# Patient Record
Sex: Female | Born: 1980 | Race: White | Hispanic: No | Marital: Married | Smoking: Former smoker
Health system: Southern US, Community
[De-identification: ages and names within clinical notes are randomized; demographics above are authoritative.]

## PROBLEM LIST (undated history)

## (undated) ENCOUNTER — Inpatient Hospital Stay (HOSPITAL_COMMUNITY): Payer: Self-pay

## (undated) DIAGNOSIS — K8020 Calculus of gallbladder without cholecystitis without obstruction: Secondary | ICD-10-CM

## (undated) DIAGNOSIS — K219 Gastro-esophageal reflux disease without esophagitis: Secondary | ICD-10-CM

## (undated) DIAGNOSIS — F419 Anxiety disorder, unspecified: Secondary | ICD-10-CM

## (undated) DIAGNOSIS — N83201 Unspecified ovarian cyst, right side: Secondary | ICD-10-CM

## (undated) DIAGNOSIS — T4145XA Adverse effect of unspecified anesthetic, initial encounter: Secondary | ICD-10-CM

## (undated) DIAGNOSIS — T7840XA Allergy, unspecified, initial encounter: Secondary | ICD-10-CM

## (undated) DIAGNOSIS — T8859XA Other complications of anesthesia, initial encounter: Secondary | ICD-10-CM

## (undated) DIAGNOSIS — F32A Depression, unspecified: Secondary | ICD-10-CM

## (undated) DIAGNOSIS — F329 Major depressive disorder, single episode, unspecified: Secondary | ICD-10-CM

## (undated) DIAGNOSIS — Z9889 Other specified postprocedural states: Secondary | ICD-10-CM

## (undated) DIAGNOSIS — R112 Nausea with vomiting, unspecified: Secondary | ICD-10-CM

## (undated) DIAGNOSIS — D649 Anemia, unspecified: Secondary | ICD-10-CM

## (undated) HISTORY — DX: Depression, unspecified: F32.A

## (undated) HISTORY — DX: Gastro-esophageal reflux disease without esophagitis: K21.9

## (undated) HISTORY — DX: Major depressive disorder, single episode, unspecified: F32.9

## (undated) HISTORY — DX: Anxiety disorder, unspecified: F41.9

## (undated) HISTORY — PX: WISDOM TOOTH EXTRACTION: SHX21

## (undated) HISTORY — PX: NASAL SEPTOPLASTY W/ TURBINOPLASTY: SHX2070

## (undated) HISTORY — DX: Anemia, unspecified: D64.9

## (undated) HISTORY — DX: Allergy, unspecified, initial encounter: T78.40XA

---

## 2007-06-18 HISTORY — PX: NASAL SEPTOPLASTY W/ TURBINOPLASTY: SHX2070

## 2009-12-25 ENCOUNTER — Ambulatory Visit: Payer: Self-pay | Admitting: Family Medicine

## 2009-12-25 DIAGNOSIS — L259 Unspecified contact dermatitis, unspecified cause: Secondary | ICD-10-CM

## 2009-12-25 LAB — CONVERTED CEMR LAB: Beta hcg, urine, semiquantitative: NEGATIVE

## 2010-07-17 NOTE — Assessment & Plan Note (Signed)
Summary: RASH,REDNESS,SWELLING/TJ   Vital Signs:  Patient Profile:   30 Years Old Female CC:      rash ro bilateral thighs LMP:     11/27/2009 Height:     67 inches Weight:      205 pounds O2 Sat:      100 % O2 treatment:    Room Air Temp:     98.1 degrees F oral Pulse rate:   98 / minute Resp:     14 per minute BP sitting:   117(right arm) Cuff size:   large  Pt. in pain?   yes    Location:   thighs    Type:       burning  Vitals Entered By: Lajean Saver RN (December 25, 2009 5:40 PM)  Menstrual History: LMP (date): 11/27/2009 LMP - Character: normal                   Updated Prior Medication List: ZYRTEC ALLERGY 10 MG TABS (CETIRIZINE HCL) once daily OMEPRAZOLE 20 MG CPDR (OMEPRAZOLE) once daily MULTIVITAMINS  TABS (MULTIPLE VITAMIN) once daily  Current Allergies: ! PCN ! ZOLOFT ! * SEASONAL ! * LOTIONS/SOAPSHistory of Present Illness Chief Complaint: rash ro bilateral thighs History of Present Illness:  Subjective:  Patient was at a department store about one hour prior.  Immediately after trying on a pair of new pants, she developed a well localized pruritic rash on both inner thighs.  She has a history of allergies to certain perfumes and soaps.   She is also concerned that she may be pregnant and requests a pregnancy test.  Her last menstrual period was one month ago.  She denies abdominal pain or vaginal bleeding.  REVIEW OF SYSTEMS Constitutional Symptoms      Denies fever, chills, night sweats, weight loss, weight gain, and fatigue.  Eyes       Denies change in vision, eye pain, eye discharge, glasses, contact lenses, and eye surgery. Ear/Nose/Throat/Mouth       Denies hearing loss/aids, change in hearing, ear pain, ear discharge, dizziness, frequent runny nose, frequent nose bleeds, sinus problems, sore throat, hoarseness, and tooth pain or bleeding.  Respiratory       Denies dry cough, productive cough, wheezing, shortness of breath, asthma,  bronchitis, and emphysema/COPD.  Cardiovascular       Denies murmurs, chest pain, and tires easily with exhertion.    Gastrointestinal       Complains of nausea/vomiting.      Denies stomach pain, diarrhea, constipation, blood in bowel movements, and indigestion.      Comments: every am Genitourniary       Denies painful urination, kidney stones, and loss of urinary control. Neurological       Denies paralysis, seizures, and fainting/blackouts. Musculoskeletal       Denies muscle pain, joint pain, joint stiffness, decreased range of motion, redness, swelling, muscle weakness, and gout.  Skin       Denies bruising, unusual mles/lumps or sores, and hair/skin or nail changes.      Comments: bilateral thighs Psych       Denies mood changes, temper/anger issues, anxiety/stress, speech problems, depression, and sleep problems. Other Comments: Patient was trying on jeans today at [facility] and broke out onto a rash shortly after taking the jeans off.  She states the rash is weeping and burns. She is also concerned she is pregnant and would like a pregnancy test done.   Past History:  Past Medical History:  Unremarkable  Past Surgical History: Caesarean section septaplasty  Family History: Mother- RLS, fibromyalgia Sister- Lupus  Social History: Never Smoked Alcohol use-yes /week- Drug use-no Smoking Status:  never Drug Use:  no   Objective:  No acute distress  Skin:  proximal inner thighs:  Mild bilateral erythema about 10cm dia.  No swelling or vesicles. Urine pregnancy test:  negative Assessment New Problems: CONTACT DERMATITIS&OTHER ECZEMA DUE UNSPEC CAUSE (ICD-692.9)  CONTACT DERMATIS, MILD  Plan New Medications/Changes: TRIAMCINOLONE ACETONIDE 0.1 % CREA (TRIAMCINOLONE ACETONIDE) Apply thin layer to affected area bid  #30gm x 0, 12/25/2009, Donna Christen MD  New Orders: New Patient Level III [phone] Planning Comments:   Begin trimcinolone cream two times a day until  resolved.  May take Benadryl 50mg  Q4 to 6hr as needed.  Apply cool compresses.   Follow-up with PCP if not improving   The patient and/or caregiver has been counseled thoroughly with regard to medications prescribed including dosage, schedule, interactions, rationale for use, and possible side effects and they verbalize understanding.  Diagnoses and expected course of recovery discussed and will return if not improved as expected or if the condition worsens. Patient and/or caregiver verbalized understanding.  Prescriptions: TRIAMCINOLONE ACETONIDE 0.1 % CREA (TRIAMCINOLONE ACETONIDE) Apply thin layer to affected area bid  #30gm x 0   Entered and Authorized by:   Donna Christen MD   Signed by:   Donna Christen MD on 12/25/2009   Method used:   Print then Give to Patient   RxID:   [id number]   Patient Instructions: 1)  May take Benadryl 25mg , 2 caps every 4 to 6 hours  Orders Added: 1)  New Patient Level III [99203]  Laboratory Results   Urine Tests  Date/Time Received: December 25, 2009 6:05 PM  Date/Time Reported: December 25, 2009 6:05 PM     Urine HCG: negative

## 2010-12-18 ENCOUNTER — Encounter: Payer: Self-pay | Admitting: Family Medicine

## 2010-12-18 ENCOUNTER — Inpatient Hospital Stay (INDEPENDENT_AMBULATORY_CARE_PROVIDER_SITE_OTHER)
Admission: RE | Admit: 2010-12-18 | Discharge: 2010-12-18 | Disposition: A | Discharge status: Home / Self Care | Payer: Self-pay | Source: Ambulatory Visit | Attending: Family Medicine | Admitting: Family Medicine

## 2010-12-18 DIAGNOSIS — R300 Dysuria: Secondary | ICD-10-CM

## 2010-12-18 DIAGNOSIS — N3000 Acute cystitis without hematuria: Secondary | ICD-10-CM

## 2010-12-18 LAB — CONVERTED CEMR LAB
Nitrite: POSITIVE
Specific Gravity, Urine: 1.015
Urobilinogen, UA: 4
pH: 5

## 2010-12-20 ENCOUNTER — Telehealth (INDEPENDENT_AMBULATORY_CARE_PROVIDER_SITE_OTHER): Payer: Self-pay | Admitting: *Deleted

## 2010-12-27 ENCOUNTER — Inpatient Hospital Stay (INDEPENDENT_AMBULATORY_CARE_PROVIDER_SITE_OTHER)
Admission: RE | Admit: 2010-12-27 | Discharge: 2010-12-27 | Disposition: A | Discharge status: Home / Self Care | Payer: Self-pay | Source: Ambulatory Visit | Attending: Emergency Medicine | Admitting: Emergency Medicine

## 2010-12-27 ENCOUNTER — Encounter: Payer: Self-pay | Admitting: Emergency Medicine

## 2010-12-27 DIAGNOSIS — M545 Low back pain, unspecified: Secondary | ICD-10-CM

## 2011-01-01 ENCOUNTER — Telehealth (INDEPENDENT_AMBULATORY_CARE_PROVIDER_SITE_OTHER): Payer: Self-pay | Admitting: Emergency Medicine

## 2011-01-07 ENCOUNTER — Encounter: Payer: Self-pay | Admitting: Family Medicine

## 2011-01-07 ENCOUNTER — Telehealth (INDEPENDENT_AMBULATORY_CARE_PROVIDER_SITE_OTHER): Payer: Self-pay | Admitting: *Deleted

## 2011-01-07 ENCOUNTER — Ambulatory Visit (INDEPENDENT_AMBULATORY_CARE_PROVIDER_SITE_OTHER): Payer: Self-pay | Admitting: Family Medicine

## 2011-01-07 VITALS — BP 115/77 | HR 118 | Temp 99.0°F | Ht 67.0 in | Wt 206.6 lb

## 2011-01-07 DIAGNOSIS — M545 Low back pain: Secondary | ICD-10-CM

## 2011-01-07 MED ORDER — MELOXICAM 15 MG PO TABS: 15.0000 mg | ORAL_TABLET | Freq: Every day | ORAL | Status: AC

## 2011-01-07 MED ORDER — PREDNISONE (PAK) 10 MG PO TABS: ORAL_TABLET | ORAL | Status: DC

## 2011-01-07 NOTE — Patient Instructions (Signed)
You have lumbar radiculopathy (a pinched nerve in your low back). Take tylenol for baseline pain relief (1-2 extra strength tabs 3x/day) A prednisone dose pack is the best option for immediate relief and may be prescribed with transition to an anti-inflammatory like aleve or meloxicam (if you do not have stomach or kidney issues). Tramadol or vicodin as needed for severe pain (no driving on this medicine). Flexeril as needed for muscle spasms (no driving on this medicine). Stay as active as possible. Physical therapy has been shown to be helpful as well. Strengthening of low back muscles, abdominal musculature are key for long term pain relief. If not improving, will consider further imaging (MRI) and/or other medications (neurontin, lyrica, nortriptyline) that help with pain. Follow up with me in 6 weeks (shortly after your benefits kick in).  We can give you more pain medication to bridge you there if we need to.

## 2011-01-08 ENCOUNTER — Encounter: Payer: Self-pay | Admitting: Family Medicine

## 2011-01-08 ENCOUNTER — Ambulatory Visit: Payer: Self-pay | Admitting: Family Medicine

## 2011-01-08 DIAGNOSIS — M545 Low back pain: Secondary | ICD-10-CM | POA: Insufficient documentation

## 2011-01-08 NOTE — Assessment & Plan Note (Signed)
history, exam consistent with lumbar radiculopathy from bulging or herniated disc.  Patient does not have insurance at this time though made enough that she would not qualify for [facility] coverage or medicaid - her insurance will start back again in early September.  She does have some money saved up in a health savings account to cover a physical therapy visit for home stretches and exercises so will start this.  Burst prednisone once more with transition to mobic.  Flexeril as needed, tramadol as needed.  Discussed red flags.  F/u in 6 weeks for reevaluation.  See instructions for further.

## 2011-01-08 NOTE — Progress Notes (Signed)
Subjective:    Patient ID: Teresa [name], female    DOB: December 29, 1980, 30 y.o.   MRN: [id number]  PCP: None  HPI 30 yo F here for low back pain.  Patient reports that she frequently has only minor low back pain but no known acute injury in the past. About 2 weeks ago she was sweeping the floor when she felt a pull/twinge in right side of low back that led to radiation into right leg the next day. Pain goes all the way down posterior leg to toes. Some tingling in same distribution. Went to urgent care and was given prednisone x 5 days, tramadol and flexeril.   Prednisone helped some, no benefit with flexeril, and only rarely takes tramadol because she has a daughter and the medicine makes her groggy. No bowel/bladder dysfunction. Pain really started hurting again the past 2 days. 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  Past Medical History  Diagnosis Date  . Allergy   . GERD (gastroesophageal reflux disease)     No current outpatient prescriptions on file prior to visit.    Past Surgical History  Procedure Date  . Wisdom tooth extraction   . Cesarean section   . Nasal septoplasty w/ turbinoplasty     Allergies  Allergen Reactions  . Penicillins   . Sertraline Hcl     History   Social History  . Marital Status: Single    Spouse Name: N/A    Number of Children: N/A  . Years of Education: N/A   Occupational History  . Not on file.   Social History Main Topics  . Smoking status: Former Smoker    Types: Cigarettes    Quit date: 01/06/2006  . Smokeless tobacco: Not on file  . Alcohol Use: Not on file  . Drug Use: Not on file  . Sexually Active: Not on file   Other Topics Concern  . Not on file   Social History Narrative  . No narrative on file    Family History  Problem Relation Age of Onset  . Heart attack  Father   . Hyperlipidemia Father   . Hypertension Father   . Hyperlipidemia Maternal Grandmother   . Hypertension Maternal Grandmother   . Diabetes Maternal Grandfather   . Hyperlipidemia Maternal Grandfather   . Hypertension Maternal Grandfather   . Hypertension Paternal Grandmother   . Sudden death Neg Hx     BP 115/77  Pulse 118  Temp(Src) 99 F (37.2 C) (Oral)  Ht 5\' 7"  (1.702 m)  Wt 206 lb 9.6 oz (93.713 kg)  BMI 32.36 kg/m2  Review of Systems See HPI above.    Objective:   Physical Exam Gen: NAD  Back: No gross deformity, scoliosis. TTP right lumbar paraspinal region.  No midline or bony TTP. FROM but pain starts at 10 degrees extension, 30 degrees flexion.  Worse with flexion than with extension.  Pain with right lateral rotation in right paraspinal lumbar region. Strength LEs 5/5 all muscle groups.  1+ right patellar, 2+ left patellar MSR, 2+ bilateral achilles tendons MSRs. Positive SLR on right and + crossover SLR as well. Sensation diminished medial and lateral right lower leg - intact dorsal foot. Negative logroll bilateral hips Negative fabers and piriformis stretches - pain in lumbar spine, not at SI or in buttocks.  Assessment & Plan:  1. Low back pain - history, exam consistent with lumbar radiculopathy from bulging or herniated disc.  Patient does not have insurance at this time though made enough that she would not qualify for [facility] coverage or medicaid - her insurance will start back again in early September.  She does have some money saved up in a health savings account to cover a physical therapy visit for home stretches and exercises so will start this.  Burst prednisone once more with transition to mobic.  Flexeril as needed, tramadol as needed.  Discussed red flags.  F/u in 6 weeks for reevaluation.  See instructions for further.

## 2011-05-20 NOTE — Progress Notes (Signed)
Summary: LOWER BACK PAIN (room 4)   Vital Signs:  Patient Profile:   30 Years Old Female CC:      Low back pain radiating to right leg Height:     67 inches Weight:      205.50 pounds O2 Sat:      92 % O2 treatment:    Room Air Temp:     98.3 degrees F oral Pulse rate:   78 / minute Resp:     16 per minute BP sitting:   94 / 68  (left arm) Cuff size:   regular  Pt. in pain?   yes    Location:   low back radiating to right leg  Vitals Entered By: Lavell Islam RN (December 27, 2010 2:05 PM)                   Prior Medication List:  ZYRTEC ALLERGY 10 MG TABS (CETIRIZINE HCL) once daily OMEPRAZOLE 20 MG CPDR (OMEPRAZOLE) once daily MULTIVITAMINS  TABS (MULTIPLE VITAMIN) once daily SULFAMETHOXAZOLE-TMP DS 800-160 MG TABS (SULFAMETHOXAZOLE-TRIMETHOPRIM) One by mouth two times a day   Current Allergies (reviewed today): ! PCN ! ZOLOFT ! * SEASONAL ! * LOTIONS/SOAPSHistory of Present Illness History from: patient Chief Complaint: Low back pain radiating to right leg History of Present Illness: Mahogony was here earlier this week for a UTI but those symptoms have completely resolved.  She was sweeping her floor a few days ago and felt a twinge in her R lower back.  No trauma or falling.  The pain is located in her R lower back and radiates to her R leg.  Aleve tends to help.  Her FHx is + for RA, Lupus, and fibromyalgia so she usually gets concerned when she gets pain.  She states that it's not bad but is lingering longer than she would expect.  She currently has no insurance and will get further lab testing when she starts her new job. Trauma: no Bladder/bowel incontinence: no Weakness: no Fever/chills: no Night pain: hard to get comfortable Unexplained weight loss: no Cancer/immunosuppression: no PMH of osteoporosis or chronic steroid use:  no  REVIEW OF SYSTEMS Constitutional Symptoms      Denies fever, chills, night sweats, weight loss, weight gain, and fatigue.  Eyes         Denies change in vision, eye pain, eye discharge, glasses, contact lenses, and eye surgery. Ear/Nose/Throat/Mouth       Denies hearing loss/aids, change in hearing, ear pain, ear discharge, dizziness, frequent runny nose, frequent nose bleeds, sinus problems, sore throat, hoarseness, and tooth pain or bleeding.  Respiratory       Denies dry cough, productive cough, wheezing, shortness of breath, asthma, bronchitis, and emphysema/COPD.  Cardiovascular       Denies murmurs, chest pain, and tires easily with exhertion.    Gastrointestinal       Denies stomach pain, nausea/vomiting, diarrhea, constipation, blood in bowel movements, and indigestion. Genitourniary       Denies painful urination, kidney stones, and loss of urinary control. Neurological       Denies paralysis, seizures, and fainting/blackouts. Musculoskeletal       Complains of muscle pain, joint pain, joint stiffness, and decreased range of motion.      Denies redness, swelling, muscle weakness, and gout.      Comments: low back radiating right leg Skin       Denies bruising, unusual mles/lumps or sores, and hair/skin or nail changes.  Psych  Denies mood changes, temper/anger issues, anxiety/stress, speech problems, depression, and sleep problems. Other Comments: low back pain radiating down right leg x 5 days   Past History:  Family History: Last updated: 12/25/2009 Mother- RLS, fibromyalgia Sister- Lupus  Social History: Last updated: 12/25/2009 Never Smoked Alcohol use-yes /week- Drug use-no  Past Medical History: Reviewed history from 12/25/2009 and no changes required. Unremarkable  Past Surgical History: Reviewed history from 12/25/2009 and no changes required. Caesarean section septaplasty  Family History: Reviewed history from 12/25/2009 and no changes required. Mother- RLS, fibromyalgia Sister- Lupus  Social History: Reviewed history from 12/25/2009 and no changes required. Never  Smoked Alcohol use-yes /week- Drug use-no Physical Exam General appearance: well developed, well nourished, mild distress when transferring from seat to table MSE: oriented to time, place, and person Back: FROM, SLR and crossed SLR is positive for R sided pain. TTP and spasms R lumbar paraspinal, R SI joint and mildly over piriformis and gluts.  No midline tenderness. No CVA tenderness. Normal sensation and strength in LE, normal gait, distal pulses intact. Assessment New Problems: LOWER BACK PAIN (ICD-724.2)   Plan New Medications/Changes: TRAMADOL HCL 50 MG TABS (TRAMADOL HCL) 1 by mouth q6 hrs for pain  #16 x 0, 12/27/2010, Hoyt Koch MD PREDNISONE 20 MG TABS (PREDNISONE) 1 by mouth two times a day for 5 days  #10 x 0, 12/27/2010, Hoyt Koch MD FLEXERIL 5 MG TABS (CYCLOBENZAPRINE HCL) 1 by mouth up to three times a day as needed for spasms  #15 x 0, 12/27/2010, Hoyt Koch MD  New Orders: Est. Patient Level I [id number] Planning Comments:   I feel this is likely a back strain secondary to lifting or housework that is possibly irritating her sciatic nerve (piriformis?).  With a possible underlying rheumatoid disorder, her inflammatory markers could be higher.  I would first like to treat her with prednisone + flexeril + tramadol if needed, massage, heating pad, and avoiding pushing/pulling/lifting for a few days.  No red flags seen or any cause for cauda equina, disc involvement, etc.  She would like to hold off on Xrays and labs for now due to financial reasons.  If worsening or not improving, will need referral to [facility] sports med.   DDx also includes kidney stone due to recent "UTI" but all those symptoms seem to have resolved.   The patient and/or caregiver has been counseled thoroughly with regard to medications prescribed including dosage, schedule, interactions, rationale for use, and possible side effects and they verbalize understanding.  Diagnoses and expected  course of recovery discussed and will return if not improved as expected or if the condition worsens. Patient and/or caregiver verbalized understanding.  Prescriptions: TRAMADOL HCL 50 MG TABS (TRAMADOL HCL) 1 by mouth q6 hrs for pain  #16 x 0   Entered and Authorized by:   Hoyt Koch MD   Signed by:   Hoyt Koch MD on 12/27/2010   Method used:   Print then Give to Patient   RxID:   [id number] PREDNISONE 20 MG TABS (PREDNISONE) 1 by mouth two times a day for 5 days  #10 x 0   Entered and Authorized by:   Hoyt Koch MD   Signed by:   Hoyt Koch MD on 12/27/2010   Method used:   Print then Give to Patient   RxID:   [id number] FLEXERIL 5 MG TABS (CYCLOBENZAPRINE HCL) 1 by mouth up to three times a day as needed for spasms  #15 x 0  Entered and Authorized by:   Hoyt Koch MD   Signed by:   Hoyt Koch MD on 12/27/2010   Method used:   Print then Give to Patient   RxID:   [id number]   Orders Added: 1)  Est. Patient Level I [14782]

## 2011-05-20 NOTE — Progress Notes (Signed)
Summary: POSSIBLE UTI OR KIDNEY STONE?   Vital Signs:  Patient Profile:   30 Years Old Female CC:      flank pain x 3 days, dysuria and hematuria x 1 day LMP:     12/07/2010 Height:     67 inches Weight:      205 pounds O2 Sat:      98 % O2 treatment:    Room Air Temp:     98.5 degrees F oral Pulse rate:   95 / minute Resp:     16 per minute BP sitting:   126 / 83  (left arm) Cuff size:   large  Vitals Entered By: Lajean Saver RN (December 18, 2010 2:23 PM)  Menstrual History: LMP (date): 12/07/2010                  Updated Prior Medication List: ZYRTEC ALLERGY 10 MG TABS (CETIRIZINE HCL) once daily OMEPRAZOLE 20 MG CPDR (OMEPRAZOLE) once daily MULTIVITAMINS  TABS (MULTIPLE VITAMIN) once daily  Current Allergies (reviewed today): ! PCN ! ZOLOFT ! * SEASONAL ! * LOTIONS/SOAPSHistory of Present Illness Chief Complaint: flank pain x 3 days, dysuria and hematuria x 1 day History of Present Illness:  Subjective:  Patient presents complaining of UTI symptoms for 3 days.  Complains of dysuria, frequency, and urgency.  No nocturia.  + hematuria today.  No abnormal vaginal discharge.  No fever/chills/sweats.  No abdominal pain.  She has had low back ache for 3days.  Mild nausea but no vomiting.  Last menstrual period normal 2 weeks ago.      REVIEW OF SYSTEMS Constitutional Symptoms      Denies fever, chills, night sweats, weight loss, weight gain, and fatigue.  Eyes       Denies change in vision, eye pain, eye discharge, glasses, contact lenses, and eye surgery. Ear/Nose/Throat/Mouth       Denies hearing loss/aids, change in hearing, ear pain, ear discharge, dizziness, frequent runny nose, frequent nose bleeds, sinus problems, sore throat, hoarseness, and tooth pain or bleeding.  Respiratory       Denies dry cough, productive cough, wheezing, shortness of breath, asthma, bronchitis, and emphysema/COPD.  Cardiovascular       Denies murmurs, chest pain, and tires easily with  exhertion.    Gastrointestinal       Denies stomach pain, nausea/vomiting, diarrhea, constipation, blood in bowel movements, and indigestion. Genitourniary       Complains of painful urination.      Denies blood or discharge from vagina, kidney stones, and loss of urinary control. Neurological       Denies paralysis, seizures, and fainting/blackouts. Musculoskeletal       Denies muscle pain, joint pain, joint stiffness, decreased range of motion, redness, swelling, muscle weakness, and gout.  Skin       Denies bruising, unusual mles/lumps or sores, and hair/skin or nail changes.  Psych       Denies mood changes, temper/anger issues, anxiety/stress, speech problems, depression, and sleep problems. Other Comments: Patient c/o flank pain x 3 days, dysuria, hematuria and frequency x 1 day. Taken Azo   Past History:  Past Medical History: Reviewed history from 12/25/2009 and no changes required. Unremarkable  Past Surgical History: Reviewed history from 12/25/2009 and no changes required. Caesarean section septaplasty  Family History: Reviewed history from 12/25/2009 and no changes required. Mother- RLS, fibromyalgia Sister- Lupus  Social History: Reviewed history from 12/25/2009 and no changes required. Never Smoked Alcohol use-yes /week-  Drug use-no   Objective:  Appearance:  Patient appears healthy, stated age, and in no acute distress  Eyes:  Pupils are equal, round, and reactive to light and accomodation.  Extraocular movement is intact.  Conjunctivae are not inflamed.  Mouth:  moist mucous membranes  Neck:  Supple.  No adenopathy is present.   Lungs:  Clear to auscultation.  Breath sounds are equal.  Heart:  Regular rate and rhythm without murmurs, rubs, or gallops.  Abdomen:  Tenderness over bladder without masses or hepatosplenomegaly.  Bowel sounds are present.  Mild right flank tenderness.  urinalysis (dipstick): 3+ blood, 3+ leuks, + Nit Assessment New  Problems: DYSURIA (ICD-788.1) ACUTE CYSTITIS (ICD-595.0)   Plan New Medications/Changes: SULFAMETHOXAZOLE-TMP DS 800-160 MG TABS (SULFAMETHOXAZOLE-TRIMETHOPRIM) One by mouth two times a day  #10 x 0, 12/18/2010, Donna Christen MD  New Orders: Urinalysis [CPT-81003] T-Culture, Urine [[id number]] Est. Patient Level III [82956] Planning Comments:   Urine culture pending. Begin Septra DS.  May continue OTC Azo.  Continue increased fluids. Return for worsening symptoms.  Follow-up with PCP if not improving.   The patient and/or caregiver has been counseled thoroughly with regard to medications prescribed including dosage, schedule, interactions, rationale for use, and possible side effects and they verbalize understanding.  Diagnoses and expected course of recovery discussed and will return if not improved as expected or if the condition worsens. Patient and/or caregiver verbalized understanding.  Prescriptions: SULFAMETHOXAZOLE-TMP DS 800-160 MG TABS (SULFAMETHOXAZOLE-TRIMETHOPRIM) One by mouth two times a day  #10 x 0   Entered and Authorized by:   Donna Christen MD   Signed by:   Donna Christen MD on 12/18/2010   Method used:   Print then Give to Patient   RxID:   [id number]   Orders Added: 1)  Urinalysis [CPT-81003] 2)  T-Culture, Urine [[id number]] 3)  Est. Patient Level III [02725]    Laboratory Results   Urine Tests  Date/Time Received: December 18, 2010 2:28 PM  Date/Time Reported: December 18, 2010 2:28 PM   Routine Urinalysis   Color: orange Appearance: turbid Glucose: 1+   (Normal Range: Negative) Bilirubin: 2+   (Normal Range: Negative) Ketone: 1+   (Normal Range: Negative) Spec. Gravity: 1.015   (Normal Range: 1.003-1.035) Blood: 3+   (Normal Range: Negative) pH: 5.0   (Normal Range: 5.0-8.0) Protein: 3+   (Normal Range: Negative) Urobilinogen: 4.0   (Normal Range: 0-1) Nitrite: positive   (Normal Range: Negative) Leukocyte Esterace: 3+   (Normal Range:  Negative)

## 2011-05-20 NOTE — Telephone Encounter (Signed)
  Phone Note Outgoing Call Call back at Home Phone [phone] [facility]     Call placed by: Lajean Saver RN,  December 20, 2010 2:14 PM Call placed to: Patient Summary of Call: Callback: No answer. Message left on patient's cell phone to finish antibiotic and call back with questions or concerns.

## 2011-05-20 NOTE — Telephone Encounter (Signed)
  Phone Note Outgoing Call Call back at [facility] Phone [phone]   Call placed by: Emilio Math,  January 01, 2011 2:01 PM Call placed to: Patient Summary of Call: Left msg, hope she's feeling better, call with any questions or concerns     Appended Document:  Patient called back pain is still as bad and she is finished with the prednisone.  Refered her to Dr Pearletha Forge.

## 2011-05-20 NOTE — Telephone Encounter (Signed)
  Phone Note Call from Patient   Caller: Patient Summary of Call: pt came by the office stating that she is having a lot of pain in her low back. she has an appt with dr Pearletha Forge tomorrow. called dr Lazaro Arms office and moved the pts appt up to today @ 3:30pm. pt notified. C.Inza Mikrut,LPN Initial call taken by: Clemens Catholic LPN,  January 07, 2011 1:54 PM

## 2012-07-11 ENCOUNTER — Ambulatory Visit (INDEPENDENT_AMBULATORY_CARE_PROVIDER_SITE_OTHER): Payer: BC Managed Care – PPO | Admitting: Family Medicine

## 2012-07-11 VITALS — BP 115/78 | HR 76 | Temp 98.1°F | Resp 16 | Ht 66.5 in | Wt 212.2 lb

## 2012-07-11 DIAGNOSIS — R05 Cough: Secondary | ICD-10-CM

## 2012-07-11 DIAGNOSIS — J3489 Other specified disorders of nose and nasal sinuses: Secondary | ICD-10-CM

## 2012-07-11 DIAGNOSIS — J069 Acute upper respiratory infection, unspecified: Secondary | ICD-10-CM

## 2012-07-11 DIAGNOSIS — R0981 Nasal congestion: Secondary | ICD-10-CM

## 2012-07-11 LAB — POCT INFLUENZA A/B: Influenza B, POC: NEGATIVE

## 2012-07-11 MED ORDER — IPRATROPIUM BROMIDE 0.03 % NA SOLN: 2.0000 | Freq: Four times a day (QID) | NASAL | Status: DC

## 2012-07-11 MED ORDER — ALBUTEROL SULFATE (2.5 MG/3ML) 0.083% IN NEBU
2.5000 mg | INHALATION_SOLUTION | Freq: Once | RESPIRATORY_TRACT | Status: AC
  Administered 2012-07-11: 2.5 mg via RESPIRATORY_TRACT

## 2012-07-11 MED ORDER — ALBUTEROL SULFATE HFA 108 (90 BASE) MCG/ACT IN AERS: 2.0000 | INHALATION_SPRAY | RESPIRATORY_TRACT | Status: DC | PRN

## 2012-07-11 MED ORDER — GUAIFENESIN-CODEINE 100-10 MG/5ML PO SYRP: 5.0000 mL | ORAL_SOLUTION | Freq: Four times a day (QID) | ORAL | Status: DC | PRN

## 2012-07-11 NOTE — Patient Instructions (Signed)
I recommend frequent warm salt water gargles, hot tea with honey and lemon, rest, and handwashing.  Hot showers or breathing in steam may help loosen the congestion.  Try a netti pot or sinus rinse is also likely to help you feel better and keep this from progressing.  You may want to augment with 12 hr sudafed (BEHIND the counter).  Upper Respiratory Infection, Adult An upper respiratory infection (URI) is also known as the common cold. It is often caused by a type of germ (virus). Colds are easily spread (contagious). You can pass it to others by kissing, coughing, sneezing, or drinking out of the same glass. Usually, you get better in 1 or 2 weeks.  HOME CARE   Only take medicine as told by your doctor.  Use a warm mist humidifier or breathe in steam from a hot shower.  Drink enough water and fluids to keep your pee (urine) clear or pale yellow.  Get plenty of rest.  Return to work when your temperature is back to normal or as told by your doctor. You may use a face mask and wash your hands to stop your cold from spreading. GET HELP RIGHT AWAY IF:   After the first few days, you feel you are getting worse.  You have questions about your medicine.  You have chills, shortness of breath, or brown or red spit (mucus).  You have yellow or brown snot (nasal discharge) or pain in the face, especially when you bend forward.  You have a fever, puffy (swollen) neck, pain when you swallow, or white spots in the back of your throat.  You have a bad headache, ear pain, sinus pain, or chest pain.  You have a high-pitched whistling sound when you breathe in and out (wheezing).  You have a lasting cough or cough up blood.  You have sore muscles or a stiff neck. MAKE SURE YOU:   Understand these instructions.  Will watch your condition.  Will get help right away if you are not doing well or get worse. Document Released: 11/20/2007 Document Revised: 08/26/2011 Document Reviewed:  10/08/2010 [facility] Patient Information 2013 [place], [place].

## 2012-07-11 NOTE — Progress Notes (Signed)
Subjective:    Patient ID: Teresa [name], female    DOB: [birth year], 32 y.o.   MRN: [id number] Chief Complaint  Patient presents with  . Cough    x  2 days   . Nasal Congestion    x 2 days   . Sore Throat    x x 2 days     HPI  Thinks she has a sinus infection but wants to ensure not flu. Stuff chest, coughing  Runny nose, sore throat, sinus pressure, groggy, severe HA, always achy but not more than nml. Is a [employer]ner, broadcasting/film/video. Did get flu shot this yr  Some chest heaviness. Cough nonproductive, nose red, raw, sneezing. Using dayquil w/o relief. Chills. nml gi/gu  Past Medical History  Diagnosis Date  . Allergy   . GERD (gastroesophageal reflux disease)   . Anemia   . Anxiety   . Depression    Current Outpatient Prescriptions on File Prior to Visit  Medication Sig Dispense Refill  . norethindrone-ethinyl estradiol (NECON,BREVICON,MODICON) 0.5-35 MG-MCG tablet Take 1 tablet by mouth daily.      Marland Kitchen omeprazole (PRILOSEC) 20 MG capsule Take 20 mg by mouth daily.        Marland Kitchen albuterol (PROVENTIL HFA;VENTOLIN HFA) 108 (90 BASE) MCG/ACT inhaler Inhale 2 puffs into the lungs every 4 (four) hours as needed for shortness of breath.  1 Inhaler  0  . fexofenadine (ALLEGRA) 180 MG tablet Take 180 mg by mouth daily.        Marland Kitchen ipratropium (ATROVENT) 0.03 % nasal spray Place 2 sprays into the nose 4 (four) times daily.  30 mL  1  . Multiple Vitamin (MULTIVITAMIN) tablet Take 1 tablet by mouth daily.         Allergies  Allergen Reactions  . Penicillins   . Sertraline Hcl      Review of Systems  Constitutional: Positive for chills, activity change and fatigue. Negative for fever, diaphoresis and appetite change.  HENT: Positive for ear pain, congestion, sore throat, rhinorrhea, sneezing, postnasal drip and sinus pressure. Negative for nosebleeds, trouble swallowing, neck pain, neck stiffness, voice change and ear discharge.   Eyes: Negative for discharge and itching.  Respiratory: Positive for cough and  chest tightness. Negative for shortness of breath.   Cardiovascular: Negative for chest pain.  Gastrointestinal: Negative for nausea, vomiting and abdominal pain.  Genitourinary: Negative for dysuria, decreased urine volume and difficulty urinating.  Musculoskeletal: Positive for myalgias, back pain and arthralgias.  Skin: Negative for rash.  Neurological: Positive for headaches. Negative for dizziness and syncope.  Hematological: Negative for adenopathy.  Psychiatric/Behavioral: Positive for sleep disturbance.      BP 115/78  Pulse 76  Temp 98.1 F (36.7 C) (Oral)  Resp 16  Ht 5' 6.5" (1.689 m)  Wt 212 lb 3.2 oz (96.253 kg)  BMI 33.74 kg/m2  SpO2 100%  LMP 07/04/2012 Objective:   Physical Exam  Constitutional: She is oriented to person, place, and time. She appears well-developed and well-nourished. She appears lethargic. She appears ill. No distress.  HENT:  Head: Normocephalic and atraumatic.  Right Ear: External ear and ear canal normal. Tympanic membrane is retracted. A middle ear effusion is present.  Left Ear: External ear and ear canal normal. Tympanic membrane is retracted. A middle ear effusion is present.  Nose: Mucosal edema and rhinorrhea present. Right sinus exhibits maxillary sinus tenderness. Left sinus exhibits maxillary sinus tenderness.  Mouth/Throat: Uvula is midline and mucous membranes are normal. Posterior oropharyngeal erythema present. No  oropharyngeal exudate, posterior oropharyngeal edema or tonsillar abscesses.  Eyes: Conjunctivae normal are normal. Right eye exhibits no discharge. Left eye exhibits no discharge. No scleral icterus.  Neck: Normal range of motion. Neck supple.  Cardiovascular: Normal rate, regular rhythm, normal heart sounds and intact distal pulses.   Pulmonary/Chest: Effort normal and breath sounds normal.  Lymphadenopathy:       Head (right side): Submandibular adenopathy present. No preauricular and no posterior auricular adenopathy  present.       Head (left side): Submandibular adenopathy present. No preauricular and no posterior auricular adenopathy present.    She has no cervical adenopathy.       Right: No supraclavicular adenopathy present.       Left: No supraclavicular adenopathy present.  Neurological: She is oriented to person, place, and time. She appears lethargic.  Skin: Skin is warm and dry. She is not diaphoretic. No erythema.  Psychiatric: She has a normal mood and affect. Her behavior is normal.      Results for orders placed in visit on 07/11/12  POCT INFLUENZA A/B      Component Value Range   Influenza A, POC Negative     Influenza B, POC Negative     Assessment & Plan:   1. Cough  POCT Influenza A/B, albuterol (PROVENTIL) (2.5 MG/3ML) 0.083% nebulizer solution 2.5 mg  2. Nasal congestion  POCT Influenza A/B  3. URI, acute  guaiFENesin-codeine (ROBITUSSIN AC) 100-10 MG/5ML syrup, ipratropium (ATROVENT) 0.03 % nasal spray, albuterol (PROVENTIL HFA;VENTOLIN HFA) 108 (90 BASE) MCG/ACT inhaler  Gave pt alb neb treatment in the office and it did help her chest tightness so start prn alb inhaler as suspect viral URI may be moving into bronchitis. Symptomatic treatment, hand hygiene, fluids, rest, etc. Meds ordered this encounter  Medications         . guaiFENesin-codeine (ROBITUSSIN AC) 100-10 MG/5ML syrup    Sig: Take 5 mLs by mouth 4 (four) times daily as needed for cough.    Dispense:  240 mL    Refill:  0  . ipratropium (ATROVENT) 0.03 % nasal spray    Sig: Place 2 sprays into the nose 4 (four) times daily.    Dispense:  30 mL    Refill:  1  . albuterol (PROVENTIL) (2.5 MG/3ML) 0.083% nebulizer solution 2.5 mg    Sig:   . albuterol (PROVENTIL HFA;VENTOLIN HFA) 108 (90 BASE) MCG/ACT inhaler    Sig: Inhale 2 puffs into the lungs every 4 (four) hours as needed for shortness of breath.    Dispense:  1 Inhaler    Refill:  0

## 2012-08-28 ENCOUNTER — Ambulatory Visit (INDEPENDENT_AMBULATORY_CARE_PROVIDER_SITE_OTHER): Payer: BC Managed Care – PPO | Admitting: Emergency Medicine

## 2012-08-28 ENCOUNTER — Ambulatory Visit: Payer: BC Managed Care – PPO

## 2012-08-28 VITALS — BP 132/70 | HR 117 | Temp 98.4°F | Resp 16 | Ht 66.5 in | Wt 218.0 lb

## 2012-08-28 DIAGNOSIS — R062 Wheezing: Secondary | ICD-10-CM

## 2012-08-28 DIAGNOSIS — R05 Cough: Secondary | ICD-10-CM

## 2012-08-28 MED ORDER — HYDROCOD POLST-CHLORPHEN POLST 10-8 MG/5ML PO LQCR: 5.0000 mL | Freq: Two times a day (BID) | ORAL | Status: DC | PRN

## 2012-08-28 MED ORDER — PREDNISONE 10 MG PO TABS: 10.0000 mg | ORAL_TABLET | Freq: Every day | ORAL | Status: DC

## 2012-08-28 MED ORDER — BENZONATATE 100 MG PO CAPS: ORAL_CAPSULE | ORAL | Status: DC

## 2012-08-28 MED ORDER — ALBUTEROL SULFATE (2.5 MG/3ML) 0.083% IN NEBU
2.5000 mg | INHALATION_SOLUTION | Freq: Once | RESPIRATORY_TRACT | Status: AC
  Administered 2012-08-28: 2.5 mg via RESPIRATORY_TRACT

## 2012-08-28 NOTE — Progress Notes (Signed)
  Subjective:    Patient ID: Teresa [name], female    DOB: 03-22-1981, 32 y.o.   MRN: [id number]  HPI Presents today with cough for 2 weeks. Has been taking OTC medications to alleviate cough, not seeming to help.  Went to [facility] ago and was prescribed doxycycline. Has been using inhaler prescribed due to URI last OV. She uses it recently because of issues with catching breath after coughing spells. Has recently moved into a home built in the 1950's. She states the coughing and wheezing started getting worse last night. States it feels like a rattle. Denies any signs of fever.  Review of Systems   Pre peak flow: 400 Post peak flow: 350    Objective:   Physical Exam HEENT exam TMs clear nose congested throat is normal neck is supple chest is clear however when she takes a breath she has bruxism for cough.  [facility] reading (PRIMARY) by  Dr.Venezia Sargeant no acute disease         Assessment & Plan:  Go ahead and give a albuterol treatment followed by chest x-ray. She does have a history of reflux and we'll add Zantac 150 twice a day. I did not see anything on the chest x-ray appeared she did not improve with the albuterol treatment. We'll go ahead and get a six-day taper prednisone along with Tussionex for cough also refilled some Tessalon Perles she has for cough.

## 2012-12-03 ENCOUNTER — Ambulatory Visit (INDEPENDENT_AMBULATORY_CARE_PROVIDER_SITE_OTHER): Payer: BC Managed Care – PPO | Admitting: Family Medicine

## 2012-12-03 VITALS — BP 106/78 | HR 81 | Temp 99.2°F | Resp 16 | Ht 66.5 in | Wt 216.0 lb

## 2012-12-03 DIAGNOSIS — M545 Low back pain, unspecified: Secondary | ICD-10-CM

## 2012-12-03 DIAGNOSIS — M79605 Pain in left leg: Secondary | ICD-10-CM

## 2012-12-03 MED ORDER — PREDNISONE 20 MG PO TABS: ORAL_TABLET | ORAL | Status: DC

## 2012-12-03 MED ORDER — OXAPROZIN 600 MG PO TABS: ORAL_TABLET | ORAL | Status: DC

## 2012-12-03 MED ORDER — METHOCARBAMOL 500 MG PO TABS: ORAL_TABLET | ORAL | Status: DC

## 2012-12-03 MED ORDER — TRAMADOL HCL 50 MG PO TABS: 50.0000 mg | ORAL_TABLET | Freq: Three times a day (TID) | ORAL | Status: DC | PRN

## 2012-12-03 NOTE — Patient Instructions (Addendum)
Take prednisone tapered dose as directed  Take the methocarbamol one in the morning, one in the afternoon, and 2 at bedtime for muscle relaxants  Take the oxaprozin one twice daily for pain and inflammation  Take the tramadol only if needed for severe pain   Return if worse or followup with your back specialist

## 2012-12-03 NOTE — Progress Notes (Signed)
Subjective: Patient has a history of a couple of years ago injuring her back. She's been diagnosed as having disc in her back. Last winter she had injections. It's been hurting her a lot for the past month. It's gotten worse. She says that this morning it was 9/10 and now it is down to 6/10. She has an appointment in early August with the back specialist. Knows of no specific injury this time. Next week she is going out to [facility] and doesn't want to be hurting like this.  Objective: Overweight lady in no major distress. Abdomen soft without mass or tenderness. Straight leg raise test is essentially negative until she gets to about 80 plus. She has radiculopathy to both feet she says, has decreased sensation in the right foot. He can reflexes are 1+ symmetrical. Her flexion of her spine is fair, with pain when she gets down to about 45. Extension and lateral tilt and side to side rotation is good. He is on birth control.  Assessment: Low back pain with radiculopathy  Plan: Keep her more with her back specialist. Micah Flesher ahead and treated her fairly aggressively with oral steroids and muscle accident and anti-inflammatory medication and pain medication. She wants to try to be well for her trip next week. Return for

## 2013-06-05 ENCOUNTER — Ambulatory Visit (INDEPENDENT_AMBULATORY_CARE_PROVIDER_SITE_OTHER): Payer: BC Managed Care – PPO | Admitting: Family Medicine

## 2013-06-05 VITALS — BP 100/78 | HR 78 | Temp 98.0°F | Resp 16 | Ht 67.0 in | Wt 221.0 lb

## 2013-06-05 DIAGNOSIS — J040 Acute laryngitis: Secondary | ICD-10-CM

## 2013-06-05 DIAGNOSIS — J069 Acute upper respiratory infection, unspecified: Secondary | ICD-10-CM

## 2013-06-05 DIAGNOSIS — J209 Acute bronchitis, unspecified: Secondary | ICD-10-CM

## 2013-06-05 MED ORDER — GUAIFENESIN-CODEINE 100-10 MG/5ML PO SYRP: 5.0000 mL | ORAL_SOLUTION | Freq: Four times a day (QID) | ORAL | Status: DC | PRN

## 2013-06-05 MED ORDER — ALBUTEROL SULFATE HFA 108 (90 BASE) MCG/ACT IN AERS: 2.0000 | INHALATION_SPRAY | RESPIRATORY_TRACT | Status: DC | PRN

## 2013-06-05 MED ORDER — AZITHROMYCIN 250 MG PO TABS: ORAL_TABLET | ORAL | Status: DC

## 2013-06-05 NOTE — Patient Instructions (Signed)

## 2013-06-05 NOTE — Progress Notes (Signed)
Patient ID: Arriana Lohmann MRN: [id number], DOB: [birth year], 32 y.o. Date of Encounter: 06/05/2013, 1:40 PM  Primary Physician: No PCP Per Patient  Chief Complaint:  Chief Complaint  Patient presents with   URI    x 1 week    HPI: 32 y.o. year old female with a h/o chronic back pain presents with 8 day history of gradual onset, gradually worsening sore throat. She lists a cough as an associated symptom.  Pt states her cough is sometimes productive consisting of sputum, and tends to wake her from sleep. She says her symptoms are worsened at night time. She denies rhinorrhea, sinus pressure, otalgia, or headache. Normal hearing. No GI complaints. She is able to swallow saliva, but hurts to do so. Decreased appetite secondary to sore throat. She denies a h/o asthma.   Pt teaches at [employer]  Past Medical History  Diagnosis Date   Allergy    GERD (gastroesophageal reflux disease)    Anemia    Anxiety    Depression      Home Meds: Prior to Admission medications   Medication Sig Start Date End Date Taking? Authorizing Provider  cetirizine (ZYRTEC) 10 MG tablet Take 10 mg by mouth daily.   Yes Historical Provider, MD  Multiple Vitamin (MULTIVITAMIN) tablet Take 1 tablet by mouth daily.     Yes Historical Provider, MD  norethindrone-ethinyl estradiol (NECON,BREVICON,MODICON) 0.5-35 MG-MCG tablet Take 1 tablet by mouth daily.   Yes Historical Provider, MD  omeprazole (PRILOSEC) 20 MG capsule Take 20 mg by mouth daily.     Yes Historical Provider, MD  albuterol (PROVENTIL HFA;VENTOLIN HFA) 108 (90 BASE) MCG/ACT inhaler Inhale 2 puffs into the lungs every 4 (four) hours as needed for shortness of breath. 07/11/12   Sherren Mocha, MD  benzonatate (TESSALON) 100 MG capsule 1-2 capsules 3 times a day for cough 08/28/12   Collene Gobble, MD  chlorpheniramine-HYDROcodone (TUSSIONEX PENNKINETIC ER) 10-8 MG/5ML LQCR Take 5 mLs by mouth every 12 (twelve) hours as needed  (cough). 08/28/12   Collene Gobble, MD  fexofenadine (ALLEGRA) 180 MG tablet Take 180 mg by mouth daily.      Historical Provider, MD  guaiFENesin-codeine (ROBITUSSIN AC) 100-10 MG/5ML syrup Take 5 mLs by mouth 4 (four) times daily as needed for cough. 07/11/12   Sherren Mocha, MD  ipratropium (ATROVENT) 0.03 % nasal spray Place 2 sprays into the nose 4 (four) times daily. 07/11/12   Sherren Mocha, MD  methocarbamol (ROBAXIN) 500 MG tablet Take one in the morning, one in the afternoon, and 2 at bedtime for muscle relaxant 12/03/12   Peyton Najjar, MD  oxaprozin (DAYPRO) 600 MG tablet Take one twice daily for pain and inflammation 12/03/12   Peyton Najjar, MD  predniSONE (DELTASONE) 10 MG tablet Take 1 tablet (10 mg total) by mouth daily. 6 per day x 1 day 5 per day x 1 day 4 per day x 1 day 3 per day x 1 day 2 per day x 1 day 1 per day x 1 day 08/28/12   Collene Gobble, MD  predniSONE (DELTASONE) 20 MG tablet Take 3 daily for 2 days, then 2 daily for 2 days, then one daily for 2 days for back 12/03/12   Peyton Najjar, MD  traMADol (ULTRAM) 50 MG tablet Take 1 tablet (50 mg total) by mouth every 8 (eight) hours as needed for pain. 12/03/12   Sandria Bales  Alwyn Ren, MD    Allergies:  Allergies  Allergen Reactions   Penicillins    Sertraline Hcl     History   Social History   Marital Status: Single    Spouse Name: N/A    Number of Children: N/A   Years of Education: N/A   Occupational History   Not on file.   Social History Main Topics   Smoking status: Former Smoker    Types: Cigarettes    Quit date: 01/06/2006   Smokeless tobacco: Never Used   Alcohol Use: 1.2 - 1.8 oz/week    2-3 Glasses of wine per week   Drug Use: No   Sexual Activity: Yes    Birth Control/ Protection: Condom, Pill   Other Topics Concern   Not on file   Social History Narrative   No narrative on file     Review of Systems: Constitutional: negative for chills, fever, night sweats or weight changes HEENT:  see above Cardiovascular: negative for chest pain or palpitations Respiratory: negative for hemoptysis, wheezing, or shortness of breath Abdominal: negative for abdominal pain, nausea, vomiting or diarrhea Dermatological: negative for rash Neurologic: negative for headache   Physical Exam: Blood pressure 100/78, pulse 78, temperature 98 F (36.7 C), temperature source Oral, resp. rate 16, height 5\' 7"  (1.702 m), weight 221 lb (100.245 kg), last menstrual period 05/22/2013, SpO2 100.00%., Body mass index is 34.61 kg/(m^2). General: Well developed, well nourished, in no acute distress. Head: Normocephalic, atraumatic, eyes without discharge, sclera non-icteric, nares are patent. Bilateral auditory canals clear, TM's are without perforation, pearly grey with reflective cone of light bilaterally. No sinus TTP. Oral cavity moist, dentition normal. Posterior pharynx with post nasal drip and mild erythema. No peritonsillar abscess or tonsillar exudate. Neck: Supple. No thyromegaly. Full ROM. No lymphadenopathy. Lungs: Clear bilaterally to auscultation without wheezes, rales, or rhonchi. Breathing is unlabored. Heart: RRR with S1 S2. No murmurs, rubs, or gallops appreciated. Abdomen: Soft, non-tender, non-distended with normoactive bowel sounds. No hepatomegaly. No rebound/guarding. No obvious abdominal masses. Msk:  Strength and tone normal for age. Extremities: No clubbing or cyanosis. No edema. Neuro: Alert and oriented X 3. Moves all extremities spontaneously. CNII-XII grossly in tact. Psych:  Responds to questions appropriately with a normal affect.   Labs:   ASSESSMENT AND PLAN:  32 y.o. year old female with Acute bronchitis - Plan: guaiFENesin-codeine (ROBITUSSIN AC) 100-10 MG/5ML syrup, albuterol (PROVENTIL HFA;VENTOLIN HFA) 108 (90 BASE) MCG/ACT inhaler, azithromycin (ZITHROMAX Z-PAK) 250 MG tablet  URI, acute - Plan: guaiFENesin-codeine (ROBITUSSIN AC) 100-10 MG/5ML syrup, albuterol  (PROVENTIL HFA;VENTOLIN HFA) 108 (90 BASE) MCG/ACT inhaler  Laryngitis   - -Tylenol/Motrin prn -Rest/fluids -RTC precautions -RTC 3-5 days if no improvement  Signed, Elvina Sidle, MD 06/05/2013 1:40 PM

## 2013-10-14 ENCOUNTER — Ambulatory Visit (INDEPENDENT_AMBULATORY_CARE_PROVIDER_SITE_OTHER): Payer: BC Managed Care – PPO | Admitting: Internal Medicine

## 2013-10-14 VITALS — BP 126/72 | HR 82 | Temp 98.3°F | Resp 14 | Ht 66.0 in | Wt 225.8 lb

## 2013-10-14 DIAGNOSIS — J329 Chronic sinusitis, unspecified: Secondary | ICD-10-CM

## 2013-10-14 DIAGNOSIS — J029 Acute pharyngitis, unspecified: Secondary | ICD-10-CM

## 2013-10-14 DIAGNOSIS — J309 Allergic rhinitis, unspecified: Secondary | ICD-10-CM

## 2013-10-14 DIAGNOSIS — J039 Acute tonsillitis, unspecified: Secondary | ICD-10-CM

## 2013-10-14 LAB — POCT RAPID STREP A (OFFICE): Rapid Strep A Screen: NEGATIVE

## 2013-10-14 MED ORDER — AZITHROMYCIN 500 MG PO TABS: 500.0000 mg | ORAL_TABLET | Freq: Every day | ORAL | Status: DC

## 2013-10-14 MED ORDER — FLUTICASONE PROPIONATE 50 MCG/ACT NA SUSP: 2.0000 | Freq: Every day | NASAL | Status: DC

## 2013-10-14 NOTE — Progress Notes (Signed)
   Subjective:    Patient ID: Teresa [name], female    DOB: 1981-05-16, 33 y.o.   MRN: [id number]  HPI Teresa [name] presents to the clinic today with sinus drainage and congestion. She also complains of a sore throat. Her daughter was dx with strep last night. She states she has seasonal allergies in which her symptoms started a week ago. She has been taking zyrtec daily. She has a mild non productive cough. She also has pressure of her maxillary sinuses with a headache to accompany. She also took two benadryl tablets last night which were unhelpful. Teresa [name] denies fever, body aches, nausea, vomiting, or diarrhea. Teresa [name] also denies chest pain.  She has noticed chills starting this morning.   No cp or sob.  Review of Systems School teacher    Objective:   Physical Exam  Vitals reviewed. Constitutional: She is oriented to person, place, and time. She appears well-developed and well-nourished. No distress.  HENT:  Head: Normocephalic.  Right Ear: External ear normal.  Left Ear: External ear normal.  Nose: Mucosal edema, rhinorrhea and sinus tenderness present. Right sinus exhibits maxillary sinus tenderness and frontal sinus tenderness. Left sinus exhibits maxillary sinus tenderness and frontal sinus tenderness.  Mouth/Throat: Uvula is midline. Posterior oropharyngeal erythema present.  Eyes: EOM are normal.  Neck: Normal range of motion.  Pulmonary/Chest: Effort normal.  Neurological: She is alert and oriented to person, place, and time. She exhibits normal muscle tone. Coordination normal.  Psychiatric: She has a normal mood and affect.          Assessment & Plan:  Sinusitis/Early tonsillitis Zithromax 500mg /Fluticasone

## 2013-10-14 NOTE — Patient Instructions (Signed)
Tonsillitis Tonsillitis is an infection of the throat that causes the tonsils to become red, tender, and swollen. Tonsils are collections of lymphoid tissue at the back of the throat. Each tonsil has crevices (crypts). Tonsils help fight nose and throat infections and keep infection from spreading to other parts of the body for the first 18 months of life.  CAUSES Sudden (acute) tonsillitis is usually caused by infection with streptococcal bacteria. Long-lasting (chronic) tonsillitis occurs when the crypts of the tonsils become filled with pieces of food and bacteria, which makes it easy for the tonsils to become repeatedly infected. SYMPTOMS  Symptoms of tonsillitis include:  A sore throat, with possible difficulty swallowing.  White patches on the tonsils.  Fever.  Tiredness.  New episodes of snoring during sleep, when you did not snore before.  Small, foul-smelling, yellowish-white pieces of material (tonsilloliths) that you occasionally cough up or spit out. The tonsilloliths can also cause you to have bad breath. DIAGNOSIS Tonsillitis can be diagnosed through a physical exam. Diagnosis can be confirmed with the results of lab tests, including a throat culture. TREATMENT  The goals of tonsillitis treatment include the reduction of the severity and duration of symptoms and prevention of associated conditions. Symptoms of tonsillitis can be improved with the use of steroids to reduce the swelling. Tonsillitis caused by bacteria can be treated with antibiotics. Usually, treatment with antibiotics is started before the cause of the tonsillitis is known. However, if it is determined that the cause is not bacterial, antibiotics will not treat the tonsillitis. If attacks of tonsillitis are severe and frequent, your caregiver may recommend surgery to remove the tonsils (tonsillectomy). HOME CARE INSTRUCTIONS   Rest as much as possible and get plenty of sleep.  Drink plenty of fluids. While the  throat is very sore, eat soft foods or liquids, such as sherbet, soups, or instant breakfast drinks.  Eat frozen ice pops.  Gargle with a warm or cold liquid to help soothe the throat. Mix 1/4 teaspoon of salt and 1/4 teaspoon of baking soda in in 8 oz of water. SEEK MEDICAL CARE IF:   Large, tender lumps develop in your neck.  A rash develops.  A green, yellow-brown, or bloody substance is coughed up.  You are unable to swallow liquids or food for 24 hours.  You notice that only one of the tonsils is swollen. SEEK IMMEDIATE MEDICAL CARE IF:   You develop any new symptoms such as vomiting, severe headache, stiff neck, chest pain, or trouble breathing or swallowing.  You have severe throat pain along with drooling or voice changes.  You have severe pain, unrelieved with recommended medications.  You are unable to fully open the mouth.  You develop redness, swelling, or severe pain anywhere in the neck.  You have a fever. MAKE SURE YOU:   Understand these instructions.  Will watch your condition.  Will get help right away if you are not doing well or get worse. Document Released: 03/13/2005 Document Revised: 02/03/2013 Document Reviewed: 11/20/2012 [facility] Patient Information 2014 [place], [place]. Sinusitis Sinusitis is redness, soreness, and swelling (inflammation) of the paranasal sinuses. Paranasal sinuses are air pockets within the bones of your face (beneath the eyes, the middle of the forehead, or above the eyes). In healthy paranasal sinuses, mucus is able to drain out, and air is able to circulate through them by way of your nose. However, when your paranasal sinuses are inflamed, mucus and air can become trapped. This can allow bacteria and  other germs to grow and cause infection. Sinusitis can develop quickly and last only a short time (acute) or continue over a long period (chronic). Sinusitis that lasts for more than 12 weeks is considered chronic.  CAUSES  Causes  of sinusitis include:  Allergies.  Structural abnormalities, such as displacement of the cartilage that separates your nostrils (deviated septum), which can decrease the air flow through your nose and sinuses and affect sinus drainage.  Functional abnormalities, such as when the small hairs (cilia) that line your sinuses and help remove mucus do not work properly or are not present. SYMPTOMS  Symptoms of acute and chronic sinusitis are the same. The primary symptoms are pain and pressure around the affected sinuses. Other symptoms include:  Upper toothache.  Earache.  Headache.  Bad breath.  Decreased sense of smell and taste.  A cough, which worsens when you are lying flat.  Fatigue.  Fever.  Thick drainage from your nose, which often is green and may contain pus (purulent).  Swelling and warmth over the affected sinuses. DIAGNOSIS  Your caregiver will perform a physical exam. During the exam, your caregiver may:  Look in your nose for signs of abnormal growths in your nostrils (nasal polyps).  Tap over the affected sinus to check for signs of infection.  View the inside of your sinuses (endoscopy) with a special imaging device with a light attached (endoscope), which is inserted into your sinuses. If your caregiver suspects that you have chronic sinusitis, one or more of the following tests may be recommended:  Allergy tests.  Nasal culture A sample of mucus is taken from your nose and sent to a lab and screened for bacteria.  Nasal cytology A sample of mucus is taken from your nose and examined by your caregiver to determine if your sinusitis is related to an allergy. TREATMENT  Most cases of acute sinusitis are related to a viral infection and will resolve on their own within 10 days. Sometimes medicines are prescribed to help relieve symptoms (pain medicine, decongestants, nasal steroid sprays, or saline sprays).  However, for sinusitis related to a bacterial  infection, your caregiver will prescribe antibiotic medicines. These are medicines that will help kill the bacteria causing the infection.  Rarely, sinusitis is caused by a fungal infection. In theses cases, your caregiver will prescribe antifungal medicine. For some cases of chronic sinusitis, surgery is needed. Generally, these are cases in which sinusitis recurs more than 3 times per year, despite other treatments. HOME CARE INSTRUCTIONS   Drink plenty of water. Water helps thin the mucus so your sinuses can drain more easily.  Use a humidifier.  Inhale steam 3 to 4 times a day (for example, sit in the bathroom with the shower running).  Apply a warm, moist washcloth to your face 3 to 4 times a day, or as directed by your caregiver.  Use saline nasal sprays to help moisten and clean your sinuses.  Take over-the-counter or prescription medicines for pain, discomfort, or fever only as directed by your caregiver. SEEK IMMEDIATE MEDICAL CARE IF:  You have increasing pain or severe headaches.  You have nausea, vomiting, or drowsiness.  You have swelling around your face.  You have vision problems.  You have a stiff neck.  You have difficulty breathing. MAKE SURE YOU:   Understand these instructions.  Will watch your condition.  Will get help right away if you are not doing well or get worse. Document Released: 06/03/2005 Document Revised: 08/26/2011 Document  Reviewed: 06/18/2011 [employer] Patient Information 2014 [place], [place].

## 2014-07-19 ENCOUNTER — Ambulatory Visit (INDEPENDENT_AMBULATORY_CARE_PROVIDER_SITE_OTHER): Payer: BC Managed Care – PPO | Admitting: Emergency Medicine

## 2014-07-19 VITALS — BP 122/82 | HR 89 | Temp 98.2°F | Resp 18 | Ht 67.0 in | Wt 241.8 lb

## 2014-07-19 DIAGNOSIS — J0140 Acute pansinusitis, unspecified: Secondary | ICD-10-CM

## 2014-07-19 DIAGNOSIS — J209 Acute bronchitis, unspecified: Secondary | ICD-10-CM

## 2014-07-19 MED ORDER — LEVOFLOXACIN 500 MG PO TABS: 500.0000 mg | ORAL_TABLET | Freq: Every day | ORAL | Status: AC

## 2014-07-19 MED ORDER — PSEUDOEPHEDRINE-GUAIFENESIN ER 60-600 MG PO TB12: 1.0000 | ORAL_TABLET | Freq: Two times a day (BID) | ORAL | Status: DC

## 2014-07-19 MED ORDER — HYDROCOD POLST-CHLORPHEN POLST 10-8 MG/5ML PO LQCR: 5.0000 mL | Freq: Two times a day (BID) | ORAL | Status: DC | PRN

## 2014-07-19 NOTE — Patient Instructions (Signed)

## 2014-07-19 NOTE — Progress Notes (Signed)
[facility] [address], [place] [address] [phone]  Date:  07/19/2014   Name:  Teresa [name]   DOB:  Nov 14, 1980   MRN:  [id number]  PCP:  No PCP Per Patient    Chief Complaint: Cough and Generalized Body Aches   History of Present Illness:  Teresa [name] is a 34 y.o. very pleasant female patient who presents with the following:  Several week history of nasal congestion and purulent drainage. No sore throat.  Sinus pressure Cough that is persistent with post tussive vomiting.  Purulent sputum No wheezing or shortness of breath No fever or chills No nausea No stool change Has had two zpaks with transient improvement over the past months. No improvement with over the counter medications or other home remedies. Denies other complaint or health concern today.   Patient Active Problem List   Diagnosis Date Noted  . Low back pain radiating to right leg 01/08/2011  . CONTACT DERMATITIS&OTHER ECZEMA DUE [place] CAUSE 12/25/2009    Past Medical History  Diagnosis Date  . Allergy   . GERD (gastroesophageal reflux disease)   . Anemia   . Anxiety   . Depression     Past Surgical History  Procedure Laterality Date  . Wisdom tooth extraction    . Cesarean section    . Nasal septoplasty w/ turbinoplasty      History  Substance Use Topics  . Smoking status: Former Smoker    Types: Cigarettes    Quit date: 01/06/2006  . Smokeless tobacco: Never Used  . Alcohol Use: 1.2 - 1.8 oz/week    2-3 Glasses of wine per week    Family History  Problem Relation Age of Onset  . Heart attack Father   . Hyperlipidemia Father   . Hypertension Father   . Fibromyalgia Father   . Alcohol abuse Father   . Hyperlipidemia Maternal Grandmother   . Hypertension Maternal Grandmother   . Stroke Maternal Grandmother   . Osteoporosis Maternal Grandmother   . Diabetes Maternal Grandfather   . Hyperlipidemia Maternal Grandfather   . Hypertension Maternal  Grandfather   . Hypertension Paternal Grandmother   . Arthritis Paternal Grandmother   . Cancer Paternal Grandmother   . Sudden death Neg Hx   . Fibromyalgia Mother   . Lupus Sister     Allergies  Allergen Reactions  . Penicillins   . Sertraline Hcl     Medication list has been reviewed and updated.  Current Outpatient Prescriptions on File Prior to Visit  Medication Sig Dispense Refill  . albuterol (PROVENTIL HFA;VENTOLIN HFA) 108 (90 BASE) MCG/ACT inhaler Inhale 2 puffs into the lungs every 4 (four) hours as needed for shortness of breath. 1 Inhaler 0  . ipratropium (ATROVENT) 0.03 % nasal spray Place 2 sprays into the nose 4 (four) times daily. 30 mL 1  . Multiple Vitamin (MULTIVITAMIN) tablet Take 1 tablet by mouth daily.      Marland Kitchen omeprazole (PRILOSEC) 20 MG capsule Take 20 mg by mouth daily.      Marland Kitchen azithromycin (ZITHROMAX Z-PAK) 250 MG tablet Take as directed on pack (Patient not taking: Reported on 07/19/2014) 6 tablet 0  . azithromycin (ZITHROMAX) 500 MG tablet Take 1 tablet (500 mg total) by mouth daily. (Patient not taking: Reported on 07/19/2014) 5 tablet 0  . fexofenadine (ALLEGRA) 180 MG tablet Take 180 mg by mouth daily.      . fluticasone (FLONASE) 50 MCG/ACT nasal spray Place 2 sprays  into both nostrils daily. (Patient not taking: Reported on 07/19/2014) 16 g 6  . guaiFENesin-codeine (ROBITUSSIN AC) 100-10 MG/5ML syrup Take 5 mLs by mouth 4 (four) times daily as needed for cough. (Patient not taking: Reported on 07/19/2014) 240 mL 0  . norethindrone-ethinyl estradiol (NECON,BREVICON,MODICON) 0.5-35 MG-MCG tablet Take 1 tablet by mouth daily.     No current facility-administered medications on file prior to visit.    Review of Systems:  As per HPI, otherwise negative.    Physical Examination: Filed Vitals:   07/19/14 0934  BP: 122/82  Pulse: 89  Temp: 98.2 F (36.8 C)  Resp: 18   Filed Vitals:   07/19/14 0934  Height: 5\' 7"  (1.702 m)  Weight: 241 lb 12.8 oz  (109.68 kg)   Body mass index is 37.86 kg/(m^2). Ideal Body Weight: Weight in (lb) to have BMI = 25: 159.3  GEN: WDWN, NAD, Non-toxic, A & O x 3 HEENT: Atraumatic, Normocephalic. Neck supple. No masses, No LAD. Ears and Nose: No external deformity. CV: RRR, No M/G/R. No JVD. No thrill. No extra heart sounds. PULM: CTA B, no wheezes, crackles, rhonchi. No retractions. No resp. distress. No accessory muscle use. ABD: S, NT, ND, +BS. No rebound. No HSM. EXTR: No c/c/e NEURO Normal gait.  PSYCH: Normally interactive. Conversant. Not depressed or anxious appearing.  Calm demeanor.    Assessment and Plan: Sinusitis Bronchitis tussionex mucinex d levaquin  Signed,  Ellison Carwin, MD

## 2015-02-19 ENCOUNTER — Ambulatory Visit (INDEPENDENT_AMBULATORY_CARE_PROVIDER_SITE_OTHER): Payer: BC Managed Care – PPO | Admitting: Physician Assistant

## 2015-02-19 ENCOUNTER — Ambulatory Visit (INDEPENDENT_AMBULATORY_CARE_PROVIDER_SITE_OTHER): Payer: BC Managed Care – PPO

## 2015-02-19 VITALS — BP 110/76 | HR 89 | Temp 98.8°F | Resp 16 | Ht 67.0 in | Wt 246.2 lb

## 2015-02-19 DIAGNOSIS — M25562 Pain in left knee: Secondary | ICD-10-CM | POA: Diagnosis not present

## 2015-02-19 LAB — POCT CBC
Granulocyte percent: 76 %G (ref 37–80)
HCT, POC: 39.4 % (ref 37.7–47.9)
HEMOGLOBIN: 12.7 g/dL (ref 12.2–16.2)
Lymph, poc: 1.6 (ref 0.6–3.4)
MCH: 28.6 pg (ref 27–31.2)
MCHC: 32.2 g/dL (ref 31.8–35.4)
MCV: 89 fL (ref 80–97)
MID (CBC): 0.2 (ref 0–0.9)
MPV: 7.8 fL (ref 0–99.8)
POC GRANULOCYTE: 5.8 (ref 2–6.9)
POC LYMPH PERCENT: 21.1 %L (ref 10–50)
POC MID %: 2.9 % (ref 0–12)
Platelet Count, POC: 324 10*3/uL (ref 142–424)
RBC: 4.42 M/uL (ref 4.04–5.48)
RDW, POC: 13 %
WBC: 7.6 10*3/uL (ref 4.6–10.2)

## 2015-02-19 LAB — POCT URINE PREGNANCY: Preg Test, Ur: NEGATIVE

## 2015-02-19 LAB — POCT SEDIMENTATION RATE: POCT SED RATE: 17 mm/hr (ref 0–22)

## 2015-02-19 NOTE — Progress Notes (Addendum)
[facility] [address] [place] [address] [phone]  Date:  02/19/2015   Name:  Teresa [name]   DOB:  Oct 29, 1980   MRN:  [id number]  PCP:  No PCP Per Patient    History of Present Illness:  Teresa [name] is a 34 y.o. female patient with PMH listed below including various complaints of joint pains, and myalgias, who presents to [facility] for chief complaint of left leg pain that started 2 days ago.  Patient states it began on the lateral side of her knee, and radiated down towards her toes.  Now it is also radiating up to her left hip.  She has no numbness, but a tingling pain to the touch along her entire leg.  Patient has noticed some increased swelling at the knee, and down the leg.  There is no warmth to the touch.  Patient attempted 800mg  of ibuprofen at home, which helped to relieve her pain.   Night before sxs became present, patient had gone to a pool party, had a few drinks, and is unsure if maybe she had twisted her leg as she stood up from poolside, though there was no pain that night.     Patient reports that she has a hx of miscellaneous pains which is being worked up by [employer].  At this time, it is likely fibromyalgia, though some other test are still underweigh for formal diagnosis.    Patient Active Problem List   Diagnosis Date Noted  . Low back pain radiating to right leg 01/08/2011  . CONTACT DERMATITIS&OTHER [facility]ZEMA DUE [place] CAUSE 12/25/2009    Past Medical History  Diagnosis Date  . Allergy   . GERD (gastroesophageal reflux disease)   . Anemia   . Anxiety   . Depression     Past Surgical History  Procedure Laterality Date  . Wisdom tooth extraction    . Cesarean section    . Nasal septoplasty w/ turbinoplasty      Social History  Substance Use Topics  . Smoking status: Former Smoker    Types: Cigarettes    Quit date: 01/06/2006  . Smokeless tobacco: Never Used  . Alcohol Use: 1.2 - 1.8 oz/week    2-3 Glasses of wine per week     Family History  Problem Relation Age of Onset  . Heart attack Father   . Hyperlipidemia Father   . Hypertension Father   . Fibromyalgia Father   . Alcohol abuse Father   . Hyperlipidemia Maternal Grandmother   . Hypertension Maternal Grandmother   . Stroke Maternal Grandmother   . Osteoporosis Maternal Grandmother   . Diabetes Maternal Grandfather   . Hyperlipidemia Maternal Grandfather   . Hypertension Maternal Grandfather   . Hypertension Paternal Grandmother   . Arthritis Paternal Grandmother   . Cancer Paternal Grandmother   . Cancer - Other Paternal Grandmother   . Sudden death Neg Hx   . Fibromyalgia Mother   . Lupus Sister     Allergies  Allergen Reactions  . Penicillins   . Sertraline Hcl     Medication list has been reviewed and updated.  Current Outpatient Prescriptions on File Prior to Visit  Medication Sig Dispense Refill  . albuterol (PROVENTIL HFA;VENTOLIN HFA) 108 (90 BASE) MCG/ACT inhaler Inhale 2 puffs into the lungs every 4 (four) hours as needed for shortness of breath. 1 Inhaler 0  . cetirizine (ZYRTEC) 10 MG tablet Take 10 mg by mouth daily.    Marland Kitchen  fluticasone (FLONASE) 50 MCG/ACT nasal spray Place 2 sprays into both nostrils daily. 16 g 6  . omeprazole (PRILOSEC) 20 MG capsule Take 20 mg by mouth daily.      Marland Kitchen azithromycin (ZITHROMAX Z-PAK) 250 MG tablet Take as directed on pack (Patient not taking: Reported on 07/19/2014) 6 tablet 0  . azithromycin (ZITHROMAX) 500 MG tablet Take 1 tablet (500 mg total) by mouth daily. (Patient not taking: Reported on 07/19/2014) 5 tablet 0  . chlorpheniramine-HYDROcodone (TUSSIONEX PENNKINETIC ER) 10-8 MG/5ML LQCR Take 5 mLs by mouth every 12 (twelve) hours as needed. (Patient not taking: Reported on 02/19/2015) 60 mL 0  . fexofenadine (ALLEGRA) 180 MG tablet Take 180 mg by mouth daily.      Marland Kitchen guaiFENesin-codeine (ROBITUSSIN AC) 100-10 MG/5ML syrup Take 5 mLs by mouth 4 (four) times daily as needed for cough. (Patient not  taking: Reported on 07/19/2014) 240 mL 0  . ipratropium (ATROVENT) 0.03 % nasal spray Place 2 sprays into the nose 4 (four) times daily. (Patient not taking: Reported on 02/19/2015) 30 mL 1  . Multiple Vitamin (MULTIVITAMIN) tablet Take 1 tablet by mouth daily.      . norethindrone-ethinyl estradiol (NECON,BREVICON,MODICON) 0.5-35 MG-MCG tablet Take 1 tablet by mouth daily.    . pseudoephedrine-guaifenesin (MUCINEX D) 60-600 MG per tablet Take 1 tablet by mouth every 12 (twelve) hours. (Patient not taking: Reported on 02/19/2015) 18 tablet 0   No current facility-administered medications on file prior to visit.    ROS ROS otherwise unremarkable unless listed above.   Physical Examination: BP 110/76 mmHg  Pulse 89  Temp(Src) 98.8 F (37.1 C) (Oral)  Resp 16  Ht 5\' 7"  (1.702 m)  Wt 246 lb 3.2 oz (111.676 kg)  BMI 38.55 kg/m2  SpO2 98%  LMP 02/06/2015 Ideal Body Weight: Weight in (lb) to have BMI = 25: 159.3  Physical Exam  Constitutional: She is oriented to person, place, and time. She appears well-developed and well-nourished. No distress.  HENT:  Head: Normocephalic and atraumatic.  Right Ear: External ear normal.  Left Ear: External ear normal.  Nose: Nose normal.  Mouth/Throat: Oropharynx is clear and moist. No oropharyngeal exudate.  Eyes: EOM are normal. Pupils are equal, round, and reactive to light.  Cardiovascular: Normal rate, regular rhythm, normal heart sounds and intact distal pulses.  Exam reveals no friction rub.   No murmur heard. Pulmonary/Chest: Effort normal and breath sounds normal. No respiratory distress. She has no wheezes.  Musculoskeletal: Normal range of motion. She exhibits no edema.       Left knee: She exhibits normal range of motion, no swelling, no erythema, no LCL laxity and no MCL laxity. Tenderness found. Lateral joint line tenderness noted. No medial joint line and no patellar tendon tenderness noted.  Negative anterior drawer test.  Positive  straight leg raise test.  No pain along the sciatic notch.   There is tenderness with just lightly palpating along the skin of the leg, along all dermatomes.    Neurological: She is alert and oriented to person, place, and time.  Skin: Skin is warm and dry. She is not diaphoretic.  Psychiatric: She has a normal mood and affect. Her behavior is normal.    Results for orders placed or performed in visit on 02/19/15  POCT SEDIMENTATION RATE  Result Value Ref Range   POCT SED RATE 17 0 - 22 mm/hr  POCT CBC  Result Value Ref Range   WBC 7.6 4.6 - 10.2 K/uL  Lymph, poc 1.6 0.6 - 3.4   POC LYMPH PERCENT 21.1 10 - 50 %L   MID (cbc) 0.2 0 - 0.9   POC MID % 2.9 0 - 12 %M   POC Granulocyte 5.8 2 - 6.9   Granulocyte percent 76.0 37 - 80 %G   RBC 4.42 4.04 - 5.48 M/uL   Hemoglobin 12.7 12.2 - 16.2 g/dL   HCT, POC 39.4 37.7 - 47.9 %   MCV 89.0 80 - 97 fL   MCH, POC 28.6 27 - 31.2 pg   MCHC 32.2 31.8 - 35.4 g/dL   RDW, POC 13.0 %   Platelet Count, POC 324 142 - 424 K/uL   MPV 7.8 0 - 99.8 fL  POCT urine pregnancy  Result Value Ref Range   Preg Test, Ur Negative Negative   [facility] reading (PRIMARY) by  Dr. Tamala Julian: Negative   Assessment and Plan: 34 year old female is here today for chief complaint of left leg pain.  Possibility of nerve inflammation.  This may be a manifestation of her fibromyalgia.  I have advised to continue the ibuprofen at this time.   Advised ice at the back, and the knee, along with stretches.   She may contact for work note for Tuesday, if she has not had great improvement, however advised to return to [place] or pcp if sxs worsen.   She declines crutches at this time.   Knee pain, left - Plan: POCT SEDIMENTATION RATE, POCT CBC, DG Knee Complete 4 Views Left, POCT urine pregnancy   Ivar Drape, PA-C [facility] 02/19/2015 10:22 AM

## 2015-02-19 NOTE — Patient Instructions (Signed)
Sciatica with Rehab The sciatic nerve runs from the back down the leg and is responsible for sensation and control of the muscles in the back (posterior) side of the thigh, lower leg, and foot. Sciatica is a condition that is characterized by inflammation of this nerve.  SYMPTOMS   Signs of nerve damage, including numbness and/or weakness along the posterior side of the lower extremity.  Pain in the back of the thigh that may also travel down the leg.  Pain that worsens when sitting for long periods of time.  Occasionally, pain in the back or buttock. CAUSES  Inflammation of the sciatic nerve is the cause of sciatica. The inflammation is due to something irritating the nerve. Common sources of irritation include:  Sitting for long periods of time.  Direct trauma to the nerve.  Arthritis of the spine.  Herniated or ruptured disk.  Slipping of the vertebrae (spondylolisthesis).  Pressure from soft tissues, such as muscles or ligament-like tissue (fascia). RISK INCREASES WITH:  Sports that place pressure or stress on the spine (football or weightlifting).  Poor strength and flexibility.  Failure to warm up properly before activity.  Family history of low back pain or disk disorders.  Previous back injury or surgery.  Poor body mechanics, especially when lifting, or poor posture. PREVENTION   Warm up and stretch properly before activity.  Maintain physical fitness:  Strength, flexibility, and endurance.  Cardiovascular fitness.  Learn and use proper technique, especially with posture and lifting. When possible, have coach correct improper technique.  Avoid activities that place stress on the spine. PROGNOSIS If treated properly, then sciatica usually resolves within 6 weeks. However, occasionally surgery is necessary.  RELATED COMPLICATIONS   Permanent nerve damage, including pain, numbness, tingle, or weakness.  Chronic back pain.  Risks of surgery: infection,  bleeding, nerve damage, or damage to surrounding tissues. TREATMENT Treatment initially involves resting from any activities that aggravate your symptoms. The use of ice and medication may help reduce pain and inflammation. The use of strengthening and stretching exercises may help reduce pain with activity. These exercises may be performed at home or with referral to a therapist. A therapist may recommend further treatments, such as transcutaneous electronic nerve stimulation (TENS) or ultrasound. Your caregiver may recommend corticosteroid injections to help reduce inflammation of the sciatic nerve. If symptoms persist despite non-surgical (conservative) treatment, then surgery may be recommended. MEDICATION  If pain medication is necessary, then nonsteroidal anti-inflammatory medications, such as aspirin and ibuprofen, or other minor pain relievers, such as acetaminophen, are often recommended.  Do not take pain medication for 7 days before surgery.  Prescription pain relievers may be given if deemed necessary by your caregiver. Use only as directed and only as much as you need.  Ointments applied to the skin may be helpful.  Corticosteroid injections may be given by your caregiver. These injections should be reserved for the most serious cases, because they may only be given a certain number of times. HEAT AND COLD  Cold treatment (icing) relieves pain and reduces inflammation. Cold treatment should be applied for 10 to 15 minutes every 2 to 3 hours for inflammation and pain and immediately after any activity that aggravates your symptoms. Use ice packs or massage the area with a piece of ice (ice massage).  Heat treatment may be used prior to performing the stretching and strengthening activities prescribed by your caregiver, physical therapist, or athletic trainer. Use a heat pack or soak the injury in warm water.   SEEK MEDICAL CARE IF:  Treatment seems to offer no benefit, or the condition  worsens.  Any medications produce adverse side effects. EXERCISES  RANGE OF MOTION (ROM) AND STRETCHING EXERCISES - Sciatica Most people with sciatic will find that their symptoms worsen with either excessive bending forward (flexion) or arching at the low back (extension). The exercises which will help resolve your symptoms will focus on the opposite motion. Your physician, physical therapist or athletic trainer will help you determine which exercises will be most helpful to resolve your low back pain. Do not complete any exercises without first consulting with your clinician. Discontinue any exercises which worsen your symptoms until you speak to your clinician. If you have pain, numbness or tingling which travels down into your buttocks, leg or foot, the goal of the therapy is for these symptoms to move closer to your back and eventually resolve. Occasionally, these leg symptoms will get better, but your low back pain may worsen; this is typically an indication of progress in your rehabilitation. Be certain to be very alert to any changes in your symptoms and the activities in which you participated in the 24 hours prior to the change. Sharing this information with your clinician will allow him/her to most efficiently treat your condition. These exercises may help you when beginning to rehabilitate your injury. Your symptoms may resolve with or without further involvement from your physician, physical therapist or athletic trainer. While completing these exercises, remember:   Restoring tissue flexibility helps normal motion to return to the joints. This allows healthier, less painful movement and activity.  An effective stretch should be held for at least 30 seconds.  A stretch should never be painful. You should only feel a gentle lengthening or release in the stretched tissue. FLEXION RANGE OF MOTION AND STRETCHING EXERCISES: STRETCH - Flexion, Single Knee to Chest   Lie on a firm bed or floor  with both legs extended in front of you.  Keeping one leg in contact with the floor, bring your opposite knee to your chest. Hold your leg in place by either grabbing behind your thigh or at your knee.  Pull until you feel a gentle stretch in your low back. Hold __________ seconds.  Slowly release your grasp and repeat the exercise with the opposite side. Repeat __________ times. Complete this exercise __________ times per day.  STRETCH - Flexion, Double Knee to Chest  Lie on a firm bed or floor with both legs extended in front of you.  Keeping one leg in contact with the floor, bring your opposite knee to your chest.  Tense your stomach muscles to support your back and then lift your other knee to your chest. Hold your legs in place by either grabbing behind your thighs or at your knees.  Pull both knees toward your chest until you feel a gentle stretch in your low back. Hold __________ seconds.  Tense your stomach muscles and slowly return one leg at a time to the floor. Repeat __________ times. Complete this exercise __________ times per day.  STRETCH - Low Trunk Rotation   Lie on a firm bed or floor. Keeping your legs in front of you, bend your knees so they are both pointed toward the ceiling and your feet are flat on the floor.  Extend your arms out to the side. This will stabilize your upper body by keeping your shoulders in contact with the floor.  Gently and slowly drop both knees together to one side until   you feel a gentle stretch in your low back. Hold for __________ seconds.  Tense your stomach muscles to support your low back as you bring your knees back to the starting position. Repeat the exercise to the other side. Repeat __________ times. Complete this exercise __________ times per day  EXTENSION RANGE OF MOTION AND FLEXIBILITY EXERCISES: STRETCH - Extension, Prone on Elbows  Lie on your stomach on the floor, a bed will be too soft. Place your palms about shoulder  width apart and at the height of your head.  Place your elbows under your shoulders. If this is too painful, stack pillows under your chest.  Allow your body to relax so that your hips drop lower and make contact more completely with the floor.  Hold this position for __________ seconds.  Slowly return to lying flat on the floor. Repeat __________ times. Complete this exercise __________ times per day.  RANGE OF MOTION - Extension, Prone Press Ups  Lie on your stomach on the floor, a bed will be too soft. Place your palms about shoulder width apart and at the height of your head.  Keeping your back as relaxed as possible, slowly straighten your elbows while keeping your hips on the floor. You may adjust the placement of your hands to maximize your comfort. As you gain motion, your hands will come more underneath your shoulders.  Hold this position __________ seconds.  Slowly return to lying flat on the floor. Repeat __________ times. Complete this exercise __________ times per day.  STRENGTHENING EXERCISES - Sciatica  These exercises may help you when beginning to rehabilitate your injury. These exercises should be done near your "sweet spot." This is the neutral, low-back arch, somewhere between fully rounded and fully arched, that is your least painful position. When performed in this safe range of motion, these exercises can be used for people who have either a flexion or extension based injury. These exercises may resolve your symptoms with or without further involvement from your physician, physical therapist or athletic trainer. While completing these exercises, remember:   Muscles can gain both the endurance and the strength needed for everyday activities through controlled exercises.  Complete these exercises as instructed by your physician, physical therapist or athletic trainer. Progress with the resistance and repetition exercises only as your caregiver advises.  You may  experience muscle soreness or fatigue, but the pain or discomfort you are trying to eliminate should never worsen during these exercises. If this pain does worsen, stop and make certain you are following the directions exactly. If the pain is still present after adjustments, discontinue the exercise until you can discuss the trouble with your clinician. STRENGTHENING - Deep Abdominals, Pelvic Tilt   Lie on a firm bed or floor. Keeping your legs in front of you, bend your knees so they are both pointed toward the ceiling and your feet are flat on the floor.  Tense your lower abdominal muscles to press your low back into the floor. This motion will rotate your pelvis so that your tail bone is scooping upwards rather than pointing at your feet or into the floor.  With a gentle tension and even breathing, hold this position for __________ seconds. Repeat __________ times. Complete this exercise __________ times per day.  STRENGTHENING - Abdominals, Crunches   Lie on a firm bed or floor. Keeping your legs in front of you, bend your knees so they are both pointed toward the ceiling and your feet are flat on the   floor. Cross your arms over your chest.  Slightly tip your chin down without bending your neck.  Tense your abdominals and slowly lift your trunk high enough to just clear your shoulder blades. Lifting higher can put excessive stress on the low back and does not further strengthen your abdominal muscles.  Control your return to the starting position. Repeat __________ times. Complete this exercise __________ times per day.  STRENGTHENING - Quadruped, Opposite UE/LE Lift  Assume a hands and knees position on a firm surface. Keep your hands under your shoulders and your knees under your hips. You may place padding under your knees for comfort.  Find your neutral spine and gently tense your abdominal muscles so that you can maintain this position. Your shoulders and hips should form a rectangle  that is parallel with the floor and is not twisted.  Keeping your trunk steady, lift your right hand no higher than your shoulder and then your left leg no higher than your hip. Make sure you are not holding your breath. Hold this position __________ seconds.  Continuing to keep your abdominal muscles tense and your back steady, slowly return to your starting position. Repeat with the opposite arm and leg. Repeat __________ times. Complete this exercise __________ times per day.  STRENGTHENING - Abdominals and Quadriceps, Straight Leg Raise   Lie on a firm bed or floor with both legs extended in front of you.  Keeping one leg in contact with the floor, bend the other knee so that your foot can rest flat on the floor.  Find your neutral spine, and tense your abdominal muscles to maintain your spinal position throughout the exercise.  Slowly lift your straight leg off the floor about 6 inches for a count of 15, making sure to not hold your breath.  Still keeping your neutral spine, slowly lower your leg all the way to the floor. Repeat this exercise with each leg __________ times. Complete this exercise __________ times per day. POSTURE AND BODY MECHANICS CONSIDERATIONS - Sciatica Keeping correct posture when sitting, standing or completing your activities will reduce the stress put on different body tissues, allowing injured tissues a chance to heal and limiting painful experiences. The following are general guidelines for improved posture. Your physician or physical therapist will provide you with any instructions specific to your needs. While reading these guidelines, remember:  The exercises prescribed by your provider will help you have the flexibility and strength to maintain correct postures.  The correct posture provides the optimal environment for your joints to work. All of your joints have less wear and tear when properly supported by a spine with good posture. This means you will  experience a healthier, less painful body.  Correct posture must be practiced with all of your activities, especially prolonged sitting and standing. Correct posture is as important when doing repetitive low-stress activities (typing) as it is when doing a single heavy-load activity (lifting). RESTING POSITIONS Consider which positions are most painful for you when choosing a resting position. If you have pain with flexion-based activities (sitting, bending, stooping, squatting), choose a position that allows you to rest in a less flexed posture. You would want to avoid curling into a fetal position on your side. If your pain worsens with extension-based activities (prolonged standing, working overhead), avoid resting in an extended position such as sleeping on your stomach. Most people will find more comfort when they rest with their spine in a more neutral position, neither too rounded nor too   arched. Lying on a non-sagging bed on your side with a pillow between your knees, or on your back with a pillow under your knees will often provide some relief. Keep in mind, being in any one position for a prolonged period of time, no matter how correct your posture, can still lead to stiffness. PROPER SITTING POSTURE In order to minimize stress and discomfort on your spine, you must sit with correct posture Sitting with good posture should be effortless for a healthy body. Returning to good posture is a gradual process. Many people can work toward this most comfortably by using various supports until they have the flexibility and strength to maintain this posture on their own. When sitting with proper posture, your ears will fall over your shoulders and your shoulders will fall over your hips. You should use the back of the chair to support your upper back. Your low back will be in a neutral position, just slightly arched. You may place a small pillow or folded towel at the base of your low back for support.  When  working at a desk, create an environment that supports good, upright posture. Without extra support, muscles fatigue and lead to excessive strain on joints and other tissues. Keep these recommendations in mind: CHAIR:   A chair should be able to slide under your desk when your back makes contact with the back of the chair. This allows you to work closely.  The chair's height should allow your eyes to be level with the upper part of your monitor and your hands to be slightly lower than your elbows. BODY POSITION  Your feet should make contact with the floor. If this is not possible, use a foot rest.  Keep your ears over your shoulders. This will reduce stress on your neck and low back. INCORRECT SITTING POSTURES   If you are feeling tired and unable to assume a healthy sitting posture, do not slouch or slump. This puts excessive strain on your back tissues, causing more damage and pain. Healthier options include:  Using more support, like a lumbar pillow.  Switching tasks to something that requires you to be upright or walking.  Talking a brief walk.  Lying down to rest in a neutral-spine position. PROLONGED STANDING WHILE SLIGHTLY LEANING FORWARD  When completing a task that requires you to lean forward while standing in one place for a long time, place either foot up on a stationary 2-4 inch high object to help maintain the best posture. When both feet are on the ground, the low back tends to lose its slight inward curve. If this curve flattens (or becomes too large), then the back and your other joints will experience too much stress, fatigue more quickly and can cause pain.  CORRECT STANDING POSTURES Proper standing posture should be assumed with all daily activities, even if they only take a few moments, like when brushing your teeth. As in sitting, your ears should fall over your shoulders and your shoulders should fall over your hips. You should keep a slight tension in your abdominal  muscles to brace your spine. Your tailbone should point down to the ground, not behind your body, resulting in an over-extended swayback posture.  INCORRECT STANDING POSTURES  Common incorrect standing postures include a forward head, locked knees and/or an excessive swayback. WALKING Walk with an upright posture. Your ears, shoulders and hips should all line-up. PROLONGED ACTIVITY IN A FLEXED POSITION When completing a task that requires you to bend forward   at your waist or lean over a low surface, try to find a way to stabilize 3 of 4 of your limbs. You can place a hand or elbow on your thigh or rest a knee on the surface you are reaching across. This will provide you more stability so that your muscles do not fatigue as quickly. By keeping your knees relaxed, or slightly bent, you will also reduce stress across your low back. CORRECT LIFTING TECHNIQUES DO :   Assume a wide stance. This will provide you more stability and the opportunity to get as close as possible to the object which you are lifting.  Tense your abdominals to brace your spine; then bend at the knees and hips. Keeping your back locked in a neutral-spine position, lift using your leg muscles. Lift with your legs, keeping your back straight.  Test the weight of unknown objects before attempting to lift them.  Try to keep your elbows locked down at your sides in order get the best strength from your shoulders when carrying an object.  Always ask for help when lifting heavy or awkward objects. INCORRECT LIFTING TECHNIQUES DO NOT:   Lock your knees when lifting, even if it is a small object.  Bend and twist. Pivot at your feet or move your feet when needing to change directions.  Assume that you cannot safely pick up a paperclip without proper posture. Document Released: 06/03/2005 Document Revised: 10/18/2013 Document Reviewed: 09/15/2008 [employer] Patient Information 2015 [employer]. This information is not intended to  replace advice given to you by your health care provider. Make sure you discuss any questions you have with your health care provider.  

## 2015-02-28 ENCOUNTER — Ambulatory Visit (INDEPENDENT_AMBULATORY_CARE_PROVIDER_SITE_OTHER): Payer: BC Managed Care – PPO | Admitting: Family Medicine

## 2015-02-28 VITALS — BP 128/80 | HR 95 | Temp 98.6°F | Resp 16 | Ht 67.0 in | Wt 249.0 lb

## 2015-02-28 DIAGNOSIS — M25462 Effusion, left knee: Secondary | ICD-10-CM

## 2015-02-28 DIAGNOSIS — T148XXA Other injury of unspecified body region, initial encounter: Secondary | ICD-10-CM

## 2015-02-28 DIAGNOSIS — M25562 Pain in left knee: Secondary | ICD-10-CM

## 2015-02-28 MED ORDER — MELOXICAM 15 MG PO TABS: 15.0000 mg | ORAL_TABLET | Freq: Every day | ORAL | Status: DC

## 2015-02-28 MED ORDER — CYCLOBENZAPRINE HCL 5 MG PO TABS: 5.0000 mg | ORAL_TABLET | Freq: Every evening | ORAL | Status: DC | PRN

## 2015-02-28 NOTE — Progress Notes (Signed)
Chief Complaint:  Chief Complaint  Patient presents with  . Knee Pain    left / x 1 week    HPI: Teresa [name] is a 34 y.o. 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 She has had knee pain for 9 days. No prior injuries. Xrays were neg dor fractureor dislocation, there was some OA like changes. She has been worked up for fibro and that was negative, RF and ESR and also CBC negative.  Last OV from 9/4 Teresa [name] is a 34 y.o. female patient with PMH listed below including various complaints of joint pains, and myalgias, who presents to [facility] for chief complaint of left leg pain that started 2 days ago. Patient states it began on the lateral side of her knee, and radiated down towards her toes. Now it is also radiating up to her left hip. She has no numbness, but a tingling pain to the touch along her entire leg. Patient has noticed some increased swelling at the knee, and down the leg. There is no warmth to the touch. Patient attempted 873m of ibuprofen at home, which helped to relieve her pain.  Night before sxs became present, patient had gone to a pool party, had a few drinks, and is unsure if maybe she had twisted her leg as she stood up from poolside, though there was no pain that night.  Patient reports that she has a hx of miscellaneous pains which is being worked up by [employer] At this time, it is likely fibromyalgia, though some other test are still underweigh for formal diagnosis.   IMPRESSION: There is minimal beaking of the medial tibial spine which likely reflects early osteoarthritic change. There is no significant joint space narrowing or joint effusion or other acute abnormality of the knee.   Electronically  Signed  By: David JMartiniqueM.D.  On: 02/21/2015 13:22  Past Medical History  Diagnosis Date  . Allergy   . GERD (gastroesophageal reflux disease)   . Anemia   . Anxiety   . Depression    Past Surgical History  Procedure Laterality Date  . Wisdom tooth extraction    . Cesarean section    . Nasal septoplasty w/ turbinoplasty     Social History   Social History  . Marital Status: Single    Spouse Name: N/A  . Number of Children: N/A  . Years of Education: N/A   Social History Main Topics  . Smoking status: Former Smoker    Types: Cigarettes    Quit date: 01/06/2006  . Smokeless tobacco: Never Used  . Alcohol Use: 1.2 - 1.8 oz/week    2-3 Glasses of wine per week  . Drug Use: No  . Sexual Activity: Yes    Birth Control/ Protection: Condom, Pill   Other Topics Concern  . None   Social History Narrative   Family History  Problem Relation Age of Onset  . Heart attack Father   . Hyperlipidemia Father   . Hypertension Father   . Fibromyalgia Father   . Alcohol abuse Father   . Hyperlipidemia Maternal Grandmother   . Hypertension Maternal Grandmother   . Stroke Maternal Grandmother   . Osteoporosis  Maternal Grandmother   . Diabetes Maternal Grandfather   . Hyperlipidemia Maternal Grandfather   . Hypertension Maternal Grandfather   . Hypertension Paternal Grandmother   . Arthritis Paternal Grandmother   . Cancer Paternal Grandmother   . Cancer - Other Paternal Grandmother   . Sudden death Neg Hx   . Fibromyalgia Mother   . Lupus Sister    Allergies  Allergen Reactions  . Penicillins   . Sertraline Hcl    Prior to Admission medications   Medication Sig Start Date End Date Taking? Authorizing Provider  albuterol (PROVENTIL HFA;VENTOLIN HFA) 108 (90 BASE) MCG/ACT inhaler Inhale 2 puffs into the lungs every 4 (four) hours as needed for shortness of breath. 06/05/13  Yes Robyn Haber, MD  ALPRAZolam Duanne Moron) 0.25 MG tablet Take 0.25 mg by mouth at bedtime as  needed for anxiety.   Yes Historical Provider, MD  buPROPion (WELLBUTRIN) 75 MG tablet Take 75 mg by mouth 2 (two) times daily.   Yes Historical Provider, MD  cetirizine (ZYRTEC) 10 MG tablet Take 10 mg by mouth daily.   Yes Historical Provider, MD  fluticasone (FLONASE) 50 MCG/ACT nasal spray Place 2 sprays into both nostrils daily. 10/14/13  Yes Orma Flaming, MD  omeprazole (PRILOSEC) 20 MG capsule Take 20 mg by mouth daily.     Yes Historical Provider, MD  cyclobenzaprine (FLEXERIL) 5 MG tablet Take 1 tablet (5 mg total) by mouth at bedtime as needed for muscle spasms. 02/28/15   Rosbel Buckner P Zyair Rhein, DO  fexofenadine (ALLEGRA) 180 MG tablet Take 180 mg by mouth daily.      Historical Provider, MD  ipratropium (ATROVENT) 0.03 % nasal spray Place 2 sprays into the nose 4 (four) times daily. Patient not taking: Reported on 02/19/2015 07/11/12   Shawnee Knapp, MD  meloxicam (MOBIC) 15 MG tablet Take 1 tablet (15 mg total) by mouth daily. Take as needed with food, no other NSAIDs 02/28/15   Takera Rayl P Tahirih Lair, DO  Multiple Vitamin (MULTIVITAMIN) tablet Take 1 tablet by mouth daily.      Historical Provider, MD  norethindrone-ethinyl estradiol (NECON,BREVICON,MODICON) 0.5-35 MG-MCG tablet Take 1 tablet by mouth daily.    Historical Provider, MD     ROS: The patient denies fevers, chills, night sweats, unintentional weight loss, chest pain, palpitations, wheezing, dyspnea on exertion, nausea, vomiting, abdominal pain, dysuria, hematuria, melena, numbness, weakness, or tingling.   All other systems have been reviewed and were otherwise negative with the exception of those mentioned in the HPI and as above.    PHYSICAL EXAM: Filed Vitals:   02/28/15 1624  BP: 128/80  Pulse: 95  Temp: 98.6 F (37 C)  Resp: 16   Body mass index is 38.99 kg/(m^2).   General: Alert, no acute distress, obese white female HEENT:  Normocephalic, atraumatic, oropharynx patent. EOMI, PERRLA Cardiovascular:  Regular rate and rhythm, no rubs  murmurs or gallops.  Respiratory: Clear to auscultation bilaterally.  No wheezes, rales, or rhonchi.  No cyanosis, no use of accessory musculature Abdominal: No organomegaly, abdomen is soft and non-tender, positive bowel sounds. No masses. Skin: No rashes. Neurologic: Facial musculature symmetric. Psychiatric: Patient acts appropriately throughout our interaction. Lymphatic: No cervical or submandibular lymphadenopathy Musculoskeletal: Gait intact.  Minimal swelling left medial side of knee Diffuse tenderness joint line medial and also lateral  Full AROM, full PROM 5/5 strength, 2/2 DTRs ankle ,  Neg Lachman, Neg medial jt line tenderness, neg McMurray L spine normal ROM,  Straight leg negative  Hip normal ROM    LABS: Results for orders placed or performed in visit on 02/19/15  POCT SEDIMENTATION RATE  Result Value Ref Range   POCT SED RATE 17 0 - 22 mm/hr  POCT CBC  Result Value Ref Range   WBC 7.6 4.6 - 10.2 K/uL   Lymph, poc 1.6 0.6 - 3.4   POC LYMPH PERCENT 21.1 10 - 50 %L   MID (cbc) 0.2 0 - 0.9   POC MID % 2.9 0 - 12 %M   POC Granulocyte 5.8 2 - 6.9   Granulocyte percent 76.0 37 - 80 %G   RBC 4.42 4.04 - 5.48 M/uL   Hemoglobin 12.7 12.2 - 16.2 g/dL   HCT, POC 39.4 37.7 - 47.9 %   MCV 89.0 80 - 97 fL   MCH, POC 28.6 27 - 31.2 pg   MCHC 32.2 31.8 - 35.4 g/dL   RDW, POC 13.0 %   Platelet Count, POC 324 142 - 424 K/uL   MPV 7.8 0 - 99.8 fL  POCT urine pregnancy  Result Value Ref Range   Preg Test, Ur Negative Negative     EKG/XRAY:   Primary read interpreted by Dr. Marin Comment at [facility].   ASSESSMENT/PLAN: Encounter Diagnoses  Name Primary?  . Knee pain, left Yes  . Sprain and strain   . Knee swelling, left    Hinge knee brace Rx mobic and flexeril, may take tylenol, stop ibuprofen if taking mobic Refer to ortho   Gross sideeffects, risk and benefits, and alternatives of medications d/w patient. Patient is aware that all medications have potential sideeffects and  we are unable to predict every sideeffect or drug-drug interaction that may occur.  Keylin Podolsky DO  02/28/2015 5:26 PM

## 2015-03-28 ENCOUNTER — Other Ambulatory Visit: Payer: Self-pay | Admitting: Family Medicine

## 2015-06-16 ENCOUNTER — Ambulatory Visit (INDEPENDENT_AMBULATORY_CARE_PROVIDER_SITE_OTHER): Payer: BC Managed Care – PPO | Admitting: Family Medicine

## 2015-06-16 ENCOUNTER — Telehealth: Payer: Self-pay

## 2015-06-16 VITALS — BP 136/86 | HR 105 | Temp 98.6°F | Resp 17 | Ht 66.5 in | Wt 245.0 lb

## 2015-06-16 DIAGNOSIS — F32A Depression, unspecified: Secondary | ICD-10-CM

## 2015-06-16 DIAGNOSIS — M791 Myalgia, unspecified site: Secondary | ICD-10-CM

## 2015-06-16 DIAGNOSIS — R5383 Other fatigue: Secondary | ICD-10-CM | POA: Diagnosis not present

## 2015-06-16 DIAGNOSIS — F329 Major depressive disorder, single episode, unspecified: Secondary | ICD-10-CM | POA: Diagnosis not present

## 2015-06-16 MED ORDER — BUPROPION HCL ER (XL) 150 MG PO TB24: 150.0000 mg | ORAL_TABLET | Freq: Every day | ORAL | Status: DC

## 2015-06-16 NOTE — Progress Notes (Signed)
Patient ID: Teresa [name], female    DOB: 1980-11-14  Age: 34 y.o. MRN: [id number]  Chief Complaint  Patient presents with  . Medication Refill    wellbutron   . Depression    Subjective:   34 year old lady with a long history of depression. For the past 16 years she has been on medication for depression intermittently. She had a regular primary care physician in [place] she was working with, but finds it Dr. his left. The patient had been discussing with her the fact that she has aches and pains, and they talked about fibromyalgia since a relative has that. She has generalized achiness. She has run out of her Wellbutrin 150 mg today. She does not do regular physical exercise, has a fairly sedentary life. She does not smoke. Rarely drinks any alcohol. No major specific symptoms.  Current allergies, medications, problem list, past/family and social histories reviewed.  Objective:  BP 136/86 mmHg  Pulse 105  Temp(Src) 98.6 F (37 C) (Oral)  Resp 17  Ht 5' 6.5" (1.689 m)  Wt 245 lb (111.131 kg)  BMI 38.96 kg/m2  SpO2 99%  LMP 05/27/2015  Pleasant lady, alert and oriented. Throat clear. Neck supple without nodes or thyromegaly. Chest is clear to auscultation. Heart regular without murmurs. Abdomen soft without mass or tenderness  Assessment & Plan:   Assessment: 1. Depression   2. Fatigue due to depression   3. Myalgia       Plan: Encouraged her to get regular exercise.  Her back on a antidepressant medication, may need to go up on the dose if she keeps on having a lot of troubles.  Orders Placed This Encounter  Procedures  . Comprehensive metabolic panel  . TSH  . POCT CBC    Meds ordered this encounter  Medications  . buPROPion (WELLBUTRIN XL) 150 MG 24 hr tablet    Sig: Take 1 tablet (150 mg total) by mouth daily.    Dispense:  90 tablet    Refill:  1         Patient Instructions  Take Wellbutrin XL 150 mg 1 pill daily  Regular exercise, such as  walking. It is recommended that you get at least 30 minutes 5 days a week.  Continue using the alprazolam that you have on an as-needed basis, avoiding regularly use  Return in about 6 months for reassessment  Return sooner if you're having further significant problems  I'll let you know the results of your labs in a few days. If for any reason you do not hear from [place] over the next week or 10 days call back     Return in about 6 months (around 12/15/2015).   Jahmari Esbenshade, MD 06/16/2015

## 2015-06-16 NOTE — Telephone Encounter (Signed)
Patient needs refill on Wellbutrin. It was originally prescribed from another office.   [phone]

## 2015-06-16 NOTE — Telephone Encounter (Signed)
Please see below.  Philis Fendt, MS, PA-C 4:11 PM, 06/16/2015

## 2015-06-16 NOTE — Patient Instructions (Signed)
Take Wellbutrin XL 150 mg 1 pill daily  Regular exercise, such as walking. It is recommended that you get at least 30 minutes 5 days a week.  Continue using the alprazolam that you have on an as-needed basis, avoiding regularly use  Return in about 6 months for reassessment  Return sooner if you're having further significant problems  I'll let you know the results of your labs in a few days. If for any reason you do not hear from [place] over the next week or 10 days call back

## 2015-06-16 NOTE — Telephone Encounter (Signed)
We can do 60 days at the current dosage.  Pt needs to come in soon for future refills.  Philis Fendt, MS, PA-C 4:11 PM, 06/16/2015

## 2015-06-16 NOTE — Telephone Encounter (Signed)
Can we send in or does pt have to RTC?

## 2015-06-19 NOTE — Telephone Encounter (Signed)
Left message Rx was sent.

## 2015-12-15 ENCOUNTER — Other Ambulatory Visit: Payer: Self-pay | Admitting: Family Medicine

## 2016-03-25 ENCOUNTER — Other Ambulatory Visit: Payer: Self-pay | Admitting: Physician Assistant

## 2016-03-26 NOTE — Telephone Encounter (Signed)
05/2015 last ov and labs.  Left message needs appointment this month for eval. And more refills.

## 2016-12-25 ENCOUNTER — Ambulatory Visit (INDEPENDENT_AMBULATORY_CARE_PROVIDER_SITE_OTHER): Payer: BC Managed Care – PPO

## 2016-12-25 VITALS — BP 132/84

## 2016-12-25 DIAGNOSIS — N926 Irregular menstruation, unspecified: Secondary | ICD-10-CM

## 2016-12-25 DIAGNOSIS — Z3201 Encounter for pregnancy test, result positive: Secondary | ICD-10-CM | POA: Diagnosis not present

## 2016-12-25 LAB — POCT URINE PREGNANCY: Preg Test, Ur: POSITIVE — AB

## 2016-12-25 NOTE — Progress Notes (Signed)
Pt presents for nurse visit. LMP 11/24/16. EDD 08/31/17. Pt to start PNV's and schedule NOB visit.

## 2017-01-13 ENCOUNTER — Telehealth: Payer: Self-pay

## 2017-01-13 MED ORDER — DOXYLAMINE-PYRIDOXINE ER 20-20 MG PO TBCR: 1.0000 | EXTENDED_RELEASE_TABLET | Freq: Two times a day (BID) | ORAL | 4 refills | Status: DC

## 2017-01-13 NOTE — Telephone Encounter (Signed)
Pt complains of having really bad nausea and vomiting. She would like to have something sent in to the pharmacy. She states that phenergan did not agree with her during her last pregnancy, and would like something different. Per Ernst Bowler okay to send [place] to [facility]. Rx has been sent. Pt to take Unisom and Vit- B6 until this arrives. Pt aware

## 2017-02-11 ENCOUNTER — Encounter: Payer: BC Managed Care – PPO | Admitting: Certified Nurse Midwife

## 2017-02-13 LAB — OB RESULTS CONSOLE ABO/RH: RH Type: POSITIVE

## 2017-02-13 LAB — OB RESULTS CONSOLE GC/CHLAMYDIA
Chlamydia: NEGATIVE
GC PROBE AMP, GENITAL: NEGATIVE

## 2017-02-13 LAB — OB RESULTS CONSOLE ANTIBODY SCREEN: ANTIBODY SCREEN: NEGATIVE

## 2017-02-13 LAB — OB RESULTS CONSOLE HEPATITIS B SURFACE ANTIGEN: Hepatitis B Surface Ag: NEGATIVE

## 2017-02-13 LAB — OB RESULTS CONSOLE HIV ANTIBODY (ROUTINE TESTING): HIV: NONREACTIVE

## 2017-02-13 LAB — OB RESULTS CONSOLE RUBELLA ANTIBODY, IGM: Rubella: IMMUNE

## 2017-02-13 LAB — OB RESULTS CONSOLE RPR: RPR: NONREACTIVE

## 2017-03-20 ENCOUNTER — Other Ambulatory Visit (HOSPITAL_COMMUNITY): Payer: Self-pay | Admitting: Obstetrics and Gynecology

## 2017-03-20 DIAGNOSIS — Z3689 Encounter for other specified antenatal screening: Secondary | ICD-10-CM

## 2017-04-02 ENCOUNTER — Encounter (HOSPITAL_COMMUNITY): Payer: Self-pay | Admitting: Obstetrics and Gynecology

## 2017-04-09 ENCOUNTER — Encounter (HOSPITAL_COMMUNITY): Payer: Self-pay

## 2017-04-10 ENCOUNTER — Ambulatory Visit (HOSPITAL_COMMUNITY)
Admission: RE | Admit: 2017-04-10 | Discharge: 2017-04-10 | Disposition: A | Discharge status: Home / Self Care | Payer: BC Managed Care – PPO | Source: Ambulatory Visit | Attending: Obstetrics and Gynecology | Admitting: Obstetrics and Gynecology

## 2017-04-10 ENCOUNTER — Other Ambulatory Visit (HOSPITAL_COMMUNITY): Payer: Self-pay | Admitting: *Deleted

## 2017-04-10 ENCOUNTER — Other Ambulatory Visit: Payer: Self-pay | Admitting: Obstetrics & Gynecology

## 2017-04-10 ENCOUNTER — Encounter (HOSPITAL_COMMUNITY): Payer: Self-pay

## 2017-04-10 ENCOUNTER — Other Ambulatory Visit (HOSPITAL_COMMUNITY): Payer: Self-pay | Admitting: Obstetrics and Gynecology

## 2017-04-10 DIAGNOSIS — O09522 Supervision of elderly multigravida, second trimester: Secondary | ICD-10-CM | POA: Diagnosis not present

## 2017-04-10 DIAGNOSIS — O34219 Maternal care for unspecified type scar from previous cesarean delivery: Secondary | ICD-10-CM | POA: Insufficient documentation

## 2017-04-10 DIAGNOSIS — Z8279 Family history of other congenital malformations, deformations and chromosomal abnormalities: Secondary | ICD-10-CM | POA: Insufficient documentation

## 2017-04-10 DIAGNOSIS — Z3A19 19 weeks gestation of pregnancy: Secondary | ICD-10-CM

## 2017-04-10 DIAGNOSIS — O352XX0 Maternal care for (suspected) hereditary disease in fetus, not applicable or unspecified: Secondary | ICD-10-CM

## 2017-04-10 DIAGNOSIS — Z3689 Encounter for other specified antenatal screening: Secondary | ICD-10-CM | POA: Insufficient documentation

## 2017-04-10 DIAGNOSIS — IMO0002 Reserved for concepts with insufficient information to code with codable children: Secondary | ICD-10-CM

## 2017-04-10 DIAGNOSIS — Z0489 Encounter for examination and observation for other specified reasons: Secondary | ICD-10-CM

## 2017-04-15 ENCOUNTER — Inpatient Hospital Stay (HOSPITAL_COMMUNITY)
Admission: AD | Admit: 2017-04-15 | Discharge: 2017-04-15 | Disposition: A | Discharge status: Home / Self Care | Payer: BC Managed Care – PPO | Source: Ambulatory Visit | Attending: Obstetrics and Gynecology | Admitting: Obstetrics and Gynecology

## 2017-04-15 ENCOUNTER — Encounter (HOSPITAL_COMMUNITY): Payer: Self-pay

## 2017-04-15 DIAGNOSIS — Z880 Allergy status to penicillin: Secondary | ICD-10-CM | POA: Insufficient documentation

## 2017-04-15 DIAGNOSIS — R109 Unspecified abdominal pain: Secondary | ICD-10-CM | POA: Insufficient documentation

## 2017-04-15 DIAGNOSIS — Z3A19 19 weeks gestation of pregnancy: Secondary | ICD-10-CM | POA: Insufficient documentation

## 2017-04-15 DIAGNOSIS — Z0371 Encounter for suspected problem with amniotic cavity and membrane ruled out: Secondary | ICD-10-CM

## 2017-04-15 DIAGNOSIS — Z87891 Personal history of nicotine dependence: Secondary | ICD-10-CM | POA: Diagnosis not present

## 2017-04-15 DIAGNOSIS — F419 Anxiety disorder, unspecified: Secondary | ICD-10-CM | POA: Insufficient documentation

## 2017-04-15 DIAGNOSIS — O352XX0 Maternal care for (suspected) hereditary disease in fetus, not applicable or unspecified: Secondary | ICD-10-CM | POA: Insufficient documentation

## 2017-04-15 DIAGNOSIS — O26892 Other specified pregnancy related conditions, second trimester: Secondary | ICD-10-CM | POA: Insufficient documentation

## 2017-04-15 DIAGNOSIS — Z3A20 20 weeks gestation of pregnancy: Secondary | ICD-10-CM | POA: Insufficient documentation

## 2017-04-15 DIAGNOSIS — O99612 Diseases of the digestive system complicating pregnancy, second trimester: Secondary | ICD-10-CM | POA: Insufficient documentation

## 2017-04-15 DIAGNOSIS — K219 Gastro-esophageal reflux disease without esophagitis: Secondary | ICD-10-CM | POA: Diagnosis not present

## 2017-04-15 DIAGNOSIS — Z79899 Other long term (current) drug therapy: Secondary | ICD-10-CM | POA: Insufficient documentation

## 2017-04-15 DIAGNOSIS — Z3492 Encounter for supervision of normal pregnancy, unspecified, second trimester: Secondary | ICD-10-CM

## 2017-04-15 DIAGNOSIS — O99342 Other mental disorders complicating pregnancy, second trimester: Secondary | ICD-10-CM | POA: Insufficient documentation

## 2017-04-15 DIAGNOSIS — F329 Major depressive disorder, single episode, unspecified: Secondary | ICD-10-CM | POA: Insufficient documentation

## 2017-04-15 DIAGNOSIS — O26899 Other specified pregnancy related conditions, unspecified trimester: Secondary | ICD-10-CM

## 2017-04-15 DIAGNOSIS — Z0379 Encounter for other suspected maternal and fetal conditions ruled out: Secondary | ICD-10-CM | POA: Diagnosis present

## 2017-04-15 LAB — URINALYSIS, ROUTINE W REFLEX MICROSCOPIC
Bilirubin Urine: NEGATIVE
Glucose, UA: NEGATIVE mg/dL
HGB URINE DIPSTICK: NEGATIVE
Ketones, ur: NEGATIVE mg/dL
LEUKOCYTES UA: NEGATIVE
Nitrite: NEGATIVE
PROTEIN: NEGATIVE mg/dL
Specific Gravity, Urine: 1.003 — ABNORMAL LOW (ref 1.005–1.030)
pH: 6 (ref 5.0–8.0)

## 2017-04-15 LAB — POCT FERN TEST: POCT FERN TEST: NEGATIVE

## 2017-04-15 NOTE — Discharge Instructions (Signed)
Preterm Labor and Birth Information The normal length of a pregnancy is 39-41 weeks. Preterm labor is when labor starts before 37 completed weeks of pregnancy. What are the risk factors for preterm labor? Preterm labor is more likely to occur in women who:  Have certain infections during pregnancy such as a bladder infection, sexually transmitted infection, or infection inside the uterus (chorioamnionitis).  Have a shorter-than-normal cervix.  Have gone into preterm labor before.  Have had surgery on their cervix.  Are younger than age 89 or older than age 19.  Are African American.  Are pregnant with twins or multiple babies (multiple gestation).  Take street drugs or smoke while pregnant.  Do not gain enough weight while pregnant.  Became pregnant shortly after having been pregnant.  What are the symptoms of preterm labor? Symptoms of preterm labor include:  Cramps similar to those that can happen during a menstrual period. The cramps may happen with diarrhea.  Pain in the abdomen or lower back.  Regular uterine contractions that may feel like tightening of the abdomen.  A feeling of increased pressure in the pelvis.  Increased watery or bloody mucus discharge from the vagina.  Water breaking (ruptured amniotic sac).  Why is it important to recognize signs of preterm labor? It is important to recognize signs of preterm labor because babies who are born prematurely may not be fully developed. This can put them at an increased risk for:  Long-term (chronic) heart and lung problems.  Difficulty immediately after birth with regulating body systems, including blood sugar, body temperature, heart rate, and breathing rate.  Bleeding in the brain.  Cerebral palsy.  Learning difficulties.  Death.  These risks are highest for babies who are born before 37 weeks of pregnancy. How is preterm labor treated? Treatment depends on the length of your pregnancy, your  condition, and the health of your baby. It may involve:  Having a stitch (suture) placed in your cervix to prevent your cervix from opening too early (cerclage).  Taking or being given medicines, such as: ? Hormone medicines. These may be given early in pregnancy to help support the pregnancy. ? Medicine to stop contractions. ? Medicines to help mature the babys lungs. These may be prescribed if the risk of delivery is high. ? Medicines to prevent your baby from developing cerebral palsy.  If the labor happens before 34 weeks of pregnancy, you may need to stay in the hospital. What should I do if I think I am in preterm labor? If you think that you are going into preterm labor, call your health care provider right away. How can I prevent preterm labor in future pregnancies? To increase your chance of having a full-term pregnancy:  Do not use any tobacco products, such as cigarettes, chewing tobacco, and e-cigarettes. If you need help quitting, ask your health care provider.  Do not use street drugs or medicines that have not been prescribed to you during your pregnancy.  Talk with your health care provider before taking any herbal supplements, even if you have been taking them regularly.  Make sure you gain a healthy amount of weight during your pregnancy.  Watch for infection. If you think that you might have an infection, get it checked right away.  Make sure to tell your health care provider if you have gone into preterm labor before.  This information is not intended to replace advice given to you by your health care provider. Make sure you discuss any questions  think that you might have an infection, get it checked right away.   Make sure to tell your health care provider if you have gone into preterm labor before.    This information is not intended to replace advice given to you by your health care provider. Make sure you discuss any questions you have with your health care provider.  Document Released: 08/24/2003 Document Revised: 11/14/2015 Document Reviewed: 10/25/2015  Elsevier Interactive Patient Education  2018 [employer]

## 2017-04-15 NOTE — MAU Provider Note (Signed)
History     CSN: [id number]  Arrival date and time: 04/15/17 1654   First Provider Initiated Contact with Patient 04/15/17 1734      Chief Complaint  Patient presents with  . Rupture of Membranes   HPI Teresa [name] is a 36 y.o. G2P1001 at [redacted]w[redacted]d who presents with LOF. States she went to the bathroom earlier today & noticed that her underwear was damp. Has worn a pad since then & has noticed it to be damp again. States she does not feel like something is leaking out & has not noticed a gush. States she's unsure if she is leaking urine. No odor to discharge. Denies vaginal bleeding. Reports intermittent lower abdominal cramping since last night. Can't tell how frequently she feels the pain. Rates pain 3/10. Has not treated. Positive fetal movement.   OB History    Gravida Para Term Preterm AB Living   2 1 1     1    SAB TAB Ectopic Multiple Live Births           1      Past Medical History:  Diagnosis Date  . Allergy   . Anemia   . Anxiety   . Depression   . GERD (gastroesophageal reflux disease)     Past Surgical History:  Procedure Laterality Date  . CESAREAN SECTION    . NASAL SEPTOPLASTY W/ TURBINOPLASTY    . WISDOM TOOTH EXTRACTION      Family History  Problem Relation Age of Onset  . Heart attack Father   . Hyperlipidemia Father   . Hypertension Father   . Fibromyalgia Father   . Alcohol abuse Father   . Hyperlipidemia Maternal Grandmother   . Hypertension Maternal Grandmother   . Stroke Maternal Grandmother   . Osteoporosis Maternal Grandmother   . Diabetes Maternal Grandfather   . Hyperlipidemia Maternal Grandfather   . Hypertension Maternal Grandfather   . Hypertension Paternal Grandmother   . Arthritis Paternal Grandmother   . Cancer Paternal Grandmother   . Cancer - Other Paternal Grandmother   . Fibromyalgia Mother   . Lupus Sister   . Sudden death Neg Hx     Social History  Substance Use Topics  . Smoking status: Former Smoker    Types:  Cigarettes    Quit date: 01/06/2006  . Smokeless tobacco: Never Used  . Alcohol use 1.2 - 1.8 oz/week    2 - 3 Glasses of wine per week     Comment: none with preg    Allergies:  Allergies  Allergen Reactions  . Penicillins Hives    Has patient had a PCN reaction causing immediate rash, facial/tongue/throat swelling, SOB or lightheadedness with hypotension: unknown Has patient had a PCN reaction causing severe rash involving mucus membranes or skin necrosis: unknown Has patient had a PCN reaction that required hospitalization: no  Has patient had a PCN reaction occurring within the last 10 years: no If all of the above answers are "NO", then may proceed with Cephalosporin use.   . Sertraline Hcl     Studer     Prescriptions Prior to Admission  Medication Sig Dispense Refill Last Dose  . albuterol (PROVENTIL HFA;VENTOLIN HFA) 108 (90 BASE) MCG/ACT inhaler Inhale 2 puffs into the lungs every 4 (four) hours as needed for shortness of breath. (Patient not taking: Reported on 12/25/2016) 1 Inhaler 0 Not Taking  . ALPRAZolam (XANAX) 0.25 MG tablet Take 0.25 mg by mouth at bedtime as needed for anxiety.  Not Taking  . buPROPion (WELLBUTRIN XL) 150 MG 24 hr tablet TAKE 1 TABLET BY MOUTH EVERY DAY (Patient not taking: Reported on 12/25/2016) 30 tablet 0 Not Taking  . cetirizine (ZYRTEC) 10 MG tablet Take 10 mg by mouth daily.   Taking  . cyclobenzaprine (FLEXERIL) 5 MG tablet Take 1 tablet (5 mg total) by mouth at bedtime as needed for muscle spasms. (Patient not taking: Reported on 12/25/2016) 30 tablet 0 Not Taking  . Doxylamine Succinate, Sleep, (UNISOM PO) Take by mouth.   Taking  . Doxylamine-Pyridoxine ER (BONJESTA) 20-20 MG TBCR Take 1 tablet by mouth 2 (two) times daily. (Patient not taking: Reported on 04/10/2017) 60 tablet 4 Not Taking  . fexofenadine (ALLEGRA) 180 MG tablet Take 180 mg by mouth daily. Reported on 06/16/2015   Not Taking  . fluticasone (FLONASE) 50 MCG/ACT nasal spray  Place 2 sprays into both nostrils daily. 16 g 6 Taking  . ipratropium (ATROVENT) 0.03 % nasal spray Place 2 sprays into the nose 4 (four) times daily. (Patient not taking: Reported on 12/25/2016) 30 mL 1 Not Taking  . meloxicam (MOBIC) 15 MG tablet TAKE 1 TABLET BY MOUTH DAILY WITH FOOD.  NO OTHER NSAIDS (Patient not taking: Reported on 12/25/2016) 30 tablet 0 Not Taking  . Multiple Vitamin (MULTIVITAMIN) tablet Take 1 tablet by mouth daily. Reported on 06/16/2015   Not Taking  . norethindrone-ethinyl estradiol (NECON,BREVICON,MODICON) 0.5-35 MG-MCG tablet Take 1 tablet by mouth daily. Reported on 06/16/2015   Not Taking  . omeprazole (PRILOSEC) 20 MG capsule Take 20 mg by mouth daily.     Taking  . Prenatal Vit-Fe Fumarate-FA (PRENATAL VITAMIN PO) Take by mouth.   Taking  . Pyridoxine HCl (VITAMIN B6 PO) Take by mouth.   Taking    Review of Systems  Constitutional: Negative.   Gastrointestinal: Positive for abdominal pain. Negative for constipation, diarrhea, nausea and vomiting.  Genitourinary: Positive for vaginal discharge. Negative for dysuria and vaginal bleeding.   Physical Exam   Blood pressure 123/78, pulse 95, temperature 97.9 F (36.6 C), temperature source Oral, resp. rate 18, weight 258 lb 0.6 oz (117 kg), last menstrual period 11/24/2016, SpO2 98 %.  Physical Exam  Nursing note and vitals reviewed. Constitutional: She is oriented to person, place, and time. She appears well-developed and well-nourished. No distress.  HENT:  Head: Normocephalic and atraumatic.  Eyes: Conjunctivae are normal. Right eye exhibits no discharge. Left eye exhibits no discharge. No scleral icterus.  Neck: Normal range of motion.  Respiratory: Effort normal. No respiratory distress.  GI: Soft. There is no tenderness.  Genitourinary: Cervix exhibits no friability. No bleeding in the vagina. Vaginal discharge (small amount of thin white discharge; no pooling of fluid) found.  Genitourinary Comments:  Dilation: Closed Effacement (%): Thick Exam by:: e Zoee Heeney np   Neurological: She is alert and oriented to person, place, and time.  Skin: Skin is warm and dry. She is not diaphoretic.  Psychiatric: She has a normal mood and affect. Her behavior is normal. Judgment and thought content normal.    MAU Course  Procedures Results for orders placed or performed during the hospital encounter of 04/15/17 (from the past 24 hour(s))  Urinalysis, Routine w reflex microscopic     Status: Abnormal   Collection Time: 04/15/17  5:01 PM  Result Value Ref Range   Color, Urine STRAW (A) YELLOW   APPearance CLEAR CLEAR   Specific Gravity, Urine 1.003 (L) 1.005 - 1.030   pH 6.0  5.0 - 8.0   Glucose, UA NEGATIVE NEGATIVE mg/dL   Hgb urine dipstick NEGATIVE NEGATIVE   Bilirubin Urine NEGATIVE NEGATIVE   Ketones, ur NEGATIVE NEGATIVE mg/dL   Protein, ur NEGATIVE NEGATIVE mg/dL   Nitrite NEGATIVE NEGATIVE   Leukocytes, UA NEGATIVE NEGATIVE  POCT fern test     Status: None   Collection Time: 04/15/17  9:14 PM  Result Value Ref Range   POCT Fern Test Negative = intact amniotic membranes     MDM FHT 141 On exam - no pooling of fluid, small amount of physiologic discharge Cervix closed/thick Fern negative C/w Dr. Rogue Bussing. Notified of exam. Ok to discharge home with strict return precautions.   Assessment and Plan  A: 1. Encounter for suspected PROM, with rupture of membranes not found   2. [redacted] weeks gestation of pregnancy   3. Fetal heart tones present, second trimester   4. Abdominal cramping affecting pregnancy    P: Discharge home Discussed reasons to return to MAU Keep f/u with OB or call as needed  Jorje Guild 04/15/2017, 5:35 PM

## 2017-04-15 NOTE — MAU Note (Signed)
+  lower abdominal cramping Since last night Sometimes radiates to back Rating pain 3/10 Constant in nature  State went to the bathroom earlier and noticed that her panties were wet. Patient questioning "bladder control or amniotic fluid" States currently wearing a pad.

## 2017-04-15 NOTE — Progress Notes (Signed)
Genetic Counseling  High-Risk Gestation Note  Appointment Date:  04/15/2017 Referred By: Bobbye Charleston, MD Date of Birth:  [birth year] Partner:  Selinda Eon   Pregnancy History: [id number] Estimated Date of Delivery: 08/31/17 Estimated Gestational Age: 74w4dAttending: EAbram Sander MD   I met with Mrs. Teresa Strainand her husband, Teresa [name] for genetic counseling because of a family history of neurofibromatosis type 1 (NF1).   In summary:  Discussed family history of NF1  Mr. 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 SHiginbothamif couple is interested in prenatal diagnosis  Couple not interested in amniocentesis for NF1  Mr. SMckellipsis thus not interested in molecular testing for himself  Postnatal evaluation for possible signs of NF1 is available  Discussed AMA and associated risks for fetal aneuploidy  Reviewed results of NIPS (Panorama)- wnl  Detailed ultrasound performed today  Declined amniocentesis  We began by reviewing the family history in detail. Mr. 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 He stated that he received this diagnosis in infancy/early childhood with the first sign being cafe au lait spots present at birth. 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 [place] reported that his past vision and hearing exams have been within normal limits. He does not currently receive regular follow-up for this diagnosis and has not had genetic testing. He is currently 36years old. No additional relatives were reported with neurofibromatosis. The current pregnancy is the first for the couple together; Mrs. SDidiohas a previous child from a previous relationship who is reportedly healthy. Medical records were not available today to confirm [name]'s report. They were counseled that the accuracy of the risk assessment provided today is based on the reported history.  Further genetic counseling is warranted if additional information is obtained.  We reviewed genes and chromosomes. They were counseled that NF1 is an autosomal dominant genetic condition, occurring in approximately 1 in 3000 individuals.  NF1 is characterized by a combination of neurocutaneous and skeletal features.  The majority of individuals are diagnosed based on clinical manifestations that include cafe-au-lait macules, distinctive freckling patterns (such as axillary and inguinal freckling), and cutaneous neurofibromas.  Other diagnostic criteria include Lisch nodules, skeletal abnormalities, optic nerve pathway tumors, and plexiform neurofibromas.  We reviewed that although a fully penetrant condition, there is significant variability in clinical expression and many of the clinical findings have an age-related penetrance.  They were counseled that approximately 50% of individuals with NF1 have a de novo gene mutation, while the other 50% have inherited the gene alteration. Given the report that [name] NF1 and given the autosomal dominant inheritance, we discussed that the current pregnancy (and each of Mr. SDonnellyoffspring) would have a 1 in 2 (50%) chance to inherit NF1. They understand that in the case that offspring inherit  NF1, the clinical course can  range, due to the widely variable expressivity of the condition.   We discussed that genetic testing for NF1 is clinically available, though NF1 is typically diagnosed based on clinical criteria.  Given the range of pathogenic variants in NF1, genetic testing typically is pursued via multistep analysis including gene sequencing, deletion/duplication analysis, copy number variant analysis, and in some cases cytogenetic analysis.  They were counseled that at approximately 95% of people with a clinical diagnosis of NF1 will have a detectable change in the NF1 gene by these methods of analysis. We discussed that in the case that a familial causative genetic variant were identified, prenatal diagnosis for NF1 would be available via amniocentesis. The risks, benefits, and limitations of amniocentesis were reviewed including the associated 1 in 300-500 risk for complications, including spontaneous pregnancy loss. Prenatal diagnosis for NF1 would not be expected to be informative in the absence of an identified familial pathogenic variant. Thus, if the couple is interested in prenatal diagnosis for NF1, molecular testing would first need to be pursue for Teresa [name].  The couple indicated that they would not be interested in prenatal diagnosis for NF1 via amniocentesis, even in the case that a familial genetic variant was identified given the associated risk for complications. Teresa [name] stated that he would not be interested in pursuing genetic testing for NF1 for himself at this time, given that the couple does not have interest in pursuing prenatal diagnosis via amniocentesis.   We discussed that it would be important to inform their pediatrician of this history so that their child(ren) can be assessed for possible clinical signs of NF1. We discussed that children who have inherited NF1 from an affected parent are reportedly typically identified within the first year of life given that cafe-au-lait spots are  typically the first feature of NF1 to be displayed. Greater than 95% of individuals with NF1 reportedly have cafe-au-lait spots develop in infancy. The couple understands that there is wide variability in the expression of clinical features of NF1. We also discussed the option of evaluation with medical genetics for Mr. Wurth and/or his child, if desired. There are patient management guidelines published regarding NF1, and Mr. Hearst was encouraged to establish care with a PCP. The couple may contact us in the future, if Mr. Cifelli would like [place] to facilitate a medical genetics referral for him. Additionally, we discussed that their child's pediatrician could facilitate a referral to medical genetics for their child if there is concern about possible features of NF1.   The remainder of the family histories were otherwise contributory for a nephew to the patient with unilateral club foot. He is currently 93 months old and reportedly otherwise healthy. We discussed that clubfoot/feet is a term that actually describes three distinct anomalies (talipes equinovarus, talipes calcaneovalgus, and metatarsus varus) and occurs in 1 in 1000 births. The most common type of clubfeet, talipes equinovarus, is characterized by forefoot adduction with supination, heel varus, and ankle equines, which cannot be brought back to a neutral position. They were counseled that clubfeet can be an isolated difference, occur as a feature of an underlying syndrome, or the result of neurological impairment. We discussed that the most likely mode of inheritance for nonsyndromic, isolated clubfoot/feet is multifactorial. While the majority of cases of clubfoot/feet are isolated, it is a feature in more than 200 known genetic syndromes, including both chromosomal and single gene conditions. The reported family history is most suggestive of isolated occurrence, in which  case recurrence risk for third degree relatives would likely be similar  to the general population risk. Additional information regarding the etiology for this relative may alter recurrence risk assessment. Detailed ultrasound is available to assess for club foot. The couple understand that ultrasound cannot diagnose or rule out all birth defects prenatally.  Without further information regarding the provided family history, an accurate genetic risk cannot be calculated. Further genetic counseling is warranted if more information is obtained.  Additionally, they were counseled regarding maternal age and the association with risk for chromosome conditions due to nondisjunction with aging of the ova.   We reviewed chromosomes, nondisjunction, and the associated risk for fetal aneuploidy related to a maternal age of 36 y.o. at 39w4dgestation.  They were counseled that the risk for aneuploidy decreases as gestational age increases, accounting for those pregnancies which spontaneously abort.  We specifically discussed Down syndrome (trisomy 230, trisomies 14and 134 and sex chromosome aneuploidies (47,XXX and 47,XXY) including the common features and prognoses of each. Ms. SCousepreviously had noninvasive prenatal screening (NIPS)/prenatal cell free DNA testing through her OB provider, which was within normal range. We reviewed the methodology of this screen, conditions for which it assesses, and the detection rates. Specifically, [facility], which was within normal limits for trisomies 21, 18, 13, and sex chromosome aneuploidy.    A detailed ultrasound was performed today. The ultrasound report will be sent under separate cover. There were no visualized fetal anomalies or markers suggestive of aneuploidy. Diagnostic testing was declined today.  They understand that screening tests cannot rule out all birth defects or genetic syndromes. The patient was advised of this limitation and states she still does not want additional testing at this time.    Mrs. SHiraldowas provided with written information regarding cystic fibrosis (CF), spinal muscular atrophy (SMA) and hemoglobinopathies including the carrier frequency, availability of carrier screening and prenatal diagnosis if indicated.  In addition, we discussed that CF and hemoglobinopathies are routinely screened for as part of the [facility] newborn screening panel.  She declined screening for CF, SMA and hemoglobinopathies today stating that these were previously performed and within normal limits. Results were not available to u[place]at today's visit to confirm this report.   Mrs. BKori Goinsdenied exposure to environmental toxins or chemical agents. She denied the use of alcohol, tobacco or street drugs. She denied significant viral illnesses during the course of her pregnancy.   I counseled this couple regarding the above risks and available options.  The approximate face-to-face time with the genetic counselor was 45 minutes.  KChipper Oman MS Certified Genetic Counselor 04/15/2017

## 2017-04-30 ENCOUNTER — Other Ambulatory Visit: Payer: Self-pay

## 2017-05-22 ENCOUNTER — Ambulatory Visit (HOSPITAL_COMMUNITY): Payer: BC Managed Care – PPO

## 2017-07-14 ENCOUNTER — Inpatient Hospital Stay (HOSPITAL_COMMUNITY)
Admission: AD | Admit: 2017-07-14 | Discharge: 2017-07-14 | Disposition: A | Discharge status: Home / Self Care | Payer: BC Managed Care – PPO | Source: Ambulatory Visit | Attending: Obstetrics and Gynecology | Admitting: Obstetrics and Gynecology

## 2017-07-14 ENCOUNTER — Encounter (HOSPITAL_COMMUNITY): Payer: Self-pay | Admitting: *Deleted

## 2017-07-14 DIAGNOSIS — O4703 False labor before 37 completed weeks of gestation, third trimester: Secondary | ICD-10-CM | POA: Insufficient documentation

## 2017-07-14 DIAGNOSIS — Z880 Allergy status to penicillin: Secondary | ICD-10-CM | POA: Diagnosis not present

## 2017-07-14 DIAGNOSIS — Z3A33 33 weeks gestation of pregnancy: Secondary | ICD-10-CM | POA: Diagnosis not present

## 2017-07-14 DIAGNOSIS — O479 False labor, unspecified: Secondary | ICD-10-CM

## 2017-07-14 DIAGNOSIS — Z87891 Personal history of nicotine dependence: Secondary | ICD-10-CM | POA: Diagnosis not present

## 2017-07-14 LAB — URINALYSIS, ROUTINE W REFLEX MICROSCOPIC
Bilirubin Urine: NEGATIVE
GLUCOSE, UA: NEGATIVE mg/dL
Hgb urine dipstick: NEGATIVE
Ketones, ur: NEGATIVE mg/dL
LEUKOCYTES UA: NEGATIVE
Nitrite: NEGATIVE
PH: 7 (ref 5.0–8.0)
Protein, ur: NEGATIVE mg/dL
SPECIFIC GRAVITY, URINE: 1.01 (ref 1.005–1.030)

## 2017-07-14 MED ORDER — NIFEDIPINE 10 MG PO CAPS
20.0000 mg | ORAL_CAPSULE | Freq: Once | ORAL | Status: AC
  Administered 2017-07-14: 20 mg via ORAL
  Filled 2017-07-14: qty 2

## 2017-07-14 NOTE — MAU Provider Note (Signed)
History     CSN: [id number]  Arrival date and time: 07/14/17 1535   First Provider Initiated Contact with Patient 07/14/17 1708      Chief Complaint  Patient presents with  . Contractions   HPI   Ms.Teresa [name] is a 37 y.o. female G2P1001 @ [redacted]w[redacted]d here in MAU with complaints of contractions. States she started having contractions today at noon. The pain comes and goes. She is feeling the pain every couple of minutes. She was seen in the office today and sent here for further evaluation. No bleeding. No recent intercourse. She was checked in the office today and her cervix was long and closed.   OB History    Gravida Para Term Preterm AB Living   2 1 1     1    SAB TAB Ectopic Multiple Live Births           1      Past Medical History:  Diagnosis Date  . Allergy   . Anemia   . Anxiety   . Depression   . GERD (gastroesophageal reflux disease)     Past Surgical History:  Procedure Laterality Date  . CESAREAN SECTION    . NASAL SEPTOPLASTY W/ TURBINOPLASTY    . WISDOM TOOTH EXTRACTION      Family History  Problem Relation Age of Onset  . Heart attack Father   . Hyperlipidemia Father   . Hypertension Father   . Fibromyalgia Father   . Alcohol abuse Father   . Hyperlipidemia Maternal Grandmother   . Hypertension Maternal Grandmother   . Stroke Maternal Grandmother   . Osteoporosis Maternal Grandmother   . Diabetes Maternal Grandfather   . Hyperlipidemia Maternal Grandfather   . Hypertension Maternal Grandfather   . Hypertension Paternal Grandmother   . Arthritis Paternal Grandmother   . Cancer Paternal Grandmother   . Cancer - Other Paternal Grandmother   . Fibromyalgia Mother   . Lupus Sister   . Sudden death Neg Hx     Social History   Tobacco Use  . Smoking status: Former Smoker    Types: Cigarettes    Last attempt to quit: 01/06/2006    Years since quitting: 11.5  . Smokeless tobacco: Never Used  Substance Use Topics  . Alcohol use: Yes   Alcohol/week: 1.2 - 1.8 oz    Types: 2 - 3 Glasses of wine per week    Comment: none with preg  . Drug use: No    Allergies:  Allergies  Allergen Reactions  . Penicillins Hives    Has patient had a PCN reaction causing immediate rash, facial/tongue/throat swelling, SOB or lightheadedness with hypotension: unknown Has patient had a PCN reaction causing severe rash involving mucus membranes or skin necrosis: unknown Has patient had a PCN reaction that required hospitalization: no  Has patient had a PCN reaction occurring within the last 10 years: no If all of the above answers are "NO", then may proceed with Cephalosporin use.   . Sertraline Hcl     Studer     Medications Prior to Admission  Medication Sig Dispense Refill Last Dose  . acetaminophen (TYLENOL) 500 MG tablet Take 500 mg by mouth every 6 (six) hours as needed for moderate pain.   Past Week at Unknown time  . calcium carbonate (TUMS - DOSED IN MG ELEMENTAL CALCIUM) 500 MG chewable tablet Chew 1 tablet by mouth 3 (three) times daily as needed for indigestion or heartburn.   04/14/2017 at Unknown  time  . cetirizine (ZYRTEC) 10 MG tablet Take 10 mg by mouth daily.   04/15/2017 at Unknown time  . Doxylamine Succinate, Sleep, (UNISOM PO) Take 1 tablet by mouth at bedtime.    04/14/2017 at Unknown time  . fluticasone (FLONASE) 50 MCG/ACT nasal spray Place 2 sprays into both nostrils daily. (Patient taking differently: Place 1 spray into both nostrils daily. ) 16 g 6 04/15/2017 at Unknown time  . omeprazole (PRILOSEC) 20 MG capsule Take 20 mg by mouth daily.     04/15/2017 at Unknown time  . Prenatal Vit-Fe Fumarate-FA (PRENATAL VITAMIN PO) Take 1 tablet by mouth daily.    04/15/2017 at Unknown time  . Pyridoxine HCl (VITAMIN B6 PO) Take 1 tablet by mouth at bedtime.    04/14/2017 at Unknown time   Results for orders placed or performed during the hospital encounter of 07/14/17 (from the past 48 hour(s))  Urinalysis, Routine w  reflex microscopic     Status: None   Collection Time: 07/14/17  4:00 PM  Result Value Ref Range   Color, Urine YELLOW YELLOW   APPearance CLEAR CLEAR   Specific Gravity, Urine 1.010 1.005 - 1.030   pH 7.0 5.0 - 8.0   Glucose, UA NEGATIVE NEGATIVE mg/dL   Hgb urine dipstick NEGATIVE NEGATIVE   Bilirubin Urine NEGATIVE NEGATIVE   Ketones, ur NEGATIVE NEGATIVE mg/dL   Protein, ur NEGATIVE NEGATIVE mg/dL   Nitrite NEGATIVE NEGATIVE   Leukocytes, UA NEGATIVE NEGATIVE   Review of Systems  Gastrointestinal: Positive for abdominal pain.  Genitourinary: Negative for vaginal bleeding and vaginal discharge.   Physical Exam   Blood pressure 117/70, pulse (!) 101, temperature 98.4 F (36.9 C), temperature source Oral, resp. rate 16, last menstrual period 11/24/2016, SpO2 98 %.  Physical Exam  Constitutional: She is oriented to person, place, and time. She appears well-developed and well-nourished. No distress.  HENT:  Head: Normocephalic.  Eyes: Pupils are equal, round, and reactive to light.  Respiratory: Effort normal.  GI: Soft.  Genitourinary:  Genitourinary Comments: Dilation: Closed Cervical Position: Posterior Exam by:: J. Glinda Natzke,NP  Neurological: She is alert and oriented to person, place, and time.  Skin: Skin is warm. She is not diaphoretic.  Psychiatric: Her behavior is normal.   Fetal Tracing: Baseline: 130 bpm Variability: Moderate  Accelerations: 15x15 Decelerations: None Toco: None  MAU Course  Procedures  None  MDM  Procardia 20 mg PO given Cervix unchanged from previous exam Patient states her contractions have lessened and she is not feeling pressure in her abdomen.  Discussed patient with Dr. Ouida Sills; ok for DC home.   Assessment and Plan   A:  1. Braxton Hicks contractions   2. Threatened premature labor in third trimester     P:  Discharge home with strict return precautions Preterm labor precautions  Follow up with Dr. Ouida Sills on  Thursday as scheduled Increase oral fluid.   Lezlie Lye, NP 07/14/2017 8:49 PM

## 2017-07-14 NOTE — MAU Note (Signed)
Pt sent over from office due to contractions, patient presented there with contractions today. Denies bleeding.

## 2017-07-14 NOTE — Discharge Instructions (Signed)
Braxton Hicks Contractions °Contractions of the uterus can occur throughout pregnancy, but they are not always a sign that you are in labor. You may have practice contractions called Braxton Hicks contractions. These false labor contractions are sometimes confused with true labor. °What are Braxton Hicks contractions? °Braxton Hicks contractions are tightening movements that occur in the muscles of the uterus before labor. Unlike true labor contractions, these contractions do not result in opening (dilation) and thinning of the cervix. Toward the end of pregnancy (32-34 weeks), Braxton Hicks contractions can happen more often and may become stronger. These contractions are sometimes difficult to tell apart from true labor because they can be very uncomfortable. You should not feel embarrassed if you go to the hospital with false labor. °Sometimes, the only way to tell if you are in true labor is for your health care provider to look for changes in the cervix. The health care provider will do a physical exam and may monitor your contractions. If you are not in true labor, the exam should show that your cervix is not dilating and your water has not broken. °If there are other health problems associated with your pregnancy, it is completely safe for you to be sent home with false labor. You may continue to have Braxton Hicks contractions until you go into true labor. °How to tell the difference between true labor and false labor °True labor °· Contractions last 30-70 seconds. °· Contractions become very regular. °· Discomfort is usually felt in the top of the uterus, and it spreads to the lower abdomen and low back. °· Contractions do not go away with walking. °· Contractions usually become more intense and increase in frequency. °· The cervix dilates and gets thinner. °False labor °· Contractions are usually shorter and not as strong as true labor contractions. °· Contractions are usually irregular. °· Contractions  are often felt in the front of the lower abdomen and in the groin. °· Contractions may go away when you walk around or change positions while lying down. °· Contractions get weaker and are shorter-lasting as time goes on. °· The cervix usually does not dilate or become thin. °Follow these instructions at home: °· Take over-the-counter and prescription medicines only as told by your health care provider. °· Keep up with your usual exercises and follow other instructions from your health care provider. °· Eat and drink lightly if you think you are going into labor. °· If Braxton Hicks contractions are making you uncomfortable: °? Change your position from lying down or resting to walking, or change from walking to resting. °? Sit and rest in a tub of warm water. °? Drink enough fluid to keep your urine pale yellow. Dehydration may cause these contractions. °? Do slow and deep breathing several times an hour. °· Keep all follow-up prenatal visits as told by your health care provider. This is important. °Contact a health care provider if: °· You have a fever. °· You have continuous pain in your abdomen. °Get help right away if: °· Your contractions become stronger, more regular, and closer together. °· You have fluid leaking or gushing from your vagina. °· You pass blood-tinged mucus (bloody show). °· You have bleeding from your vagina. °· You have low back pain that you never had before. °· You feel your baby’s head pushing down and causing pelvic pressure. °· Your baby is not moving inside you as much as it used to. °Summary °· Contractions that occur before labor are called Braxton   Hicks contractions, false labor, or practice contractions.  Braxton Hicks contractions are usually shorter, weaker, farther apart, and less regular than true labor contractions. True labor contractions usually become progressively stronger and regular and they become more frequent.  Manage discomfort from [facility] contractions by  changing position, resting in a warm bath, drinking plenty of water, or practicing deep breathing. This information is not intended to replace advice given to you by your health care provider. Make sure you discuss any questions you have with your health care provider. Document Released: 10/17/2016 Document Revised: 10/17/2016 Document Reviewed: 10/17/2016 Elsevier Interactive Patient Education  2018 [place].   Pelvic Rest Pelvic rest may be recommended if:  Your placenta is partially or completely covering the opening of your cervix (placenta previa).  There is bleeding between the wall of the uterus and the amniotic sac in the first trimester of pregnancy (subchorionic hemorrhage).  You went into labor too early (preterm labor).  Based on your overall health and the health of your baby, your health care provider will decide if pelvic rest is right for you. How do I rest my pelvis? For as long as told by your health care provider:  Do not have sex, sexual stimulation, or an orgasm.  Do not use tampons. Do not douche. Do not put anything in your vagina.  Do not lift anything that is heavier than 10 lb (4.5 kg).  Avoid activities that take a lot of effort (are strenuous).  Avoid any activity in which your pelvic muscles could become strained.  When should I seek medical care? Seek medical care if you have:  Cramping pain in your lower abdomen.  Vaginal discharge.  A low, dull backache.  Regular contractions.  Uterine tightening.  When should I seek immediate medical care? Seek immediate medical care if:  You have vaginal bleeding and you are pregnant.  This information is not intended to replace advice given to you by your health care provider. Make sure you discuss any questions you have with your health care provider. Document Released: 09/28/2010 Document Revised: 11/09/2015 Document Reviewed: 12/05/2014 Elsevier Interactive Patient Education  [employer].

## 2017-07-31 LAB — OB RESULTS CONSOLE GBS: STREP GROUP B AG: NEGATIVE

## 2017-08-08 ENCOUNTER — Other Ambulatory Visit: Payer: Self-pay

## 2017-08-08 ENCOUNTER — Encounter (HOSPITAL_COMMUNITY): Payer: Self-pay | Admitting: Emergency Medicine

## 2017-08-08 ENCOUNTER — Inpatient Hospital Stay (HOSPITAL_COMMUNITY)
Admission: AD | Admit: 2017-08-08 | Discharge: 2017-08-08 | Disposition: A | Discharge status: Home / Self Care | Payer: BC Managed Care – PPO | Source: Ambulatory Visit | Attending: Obstetrics and Gynecology | Admitting: Obstetrics and Gynecology

## 2017-08-08 DIAGNOSIS — N898 Other specified noninflammatory disorders of vagina: Secondary | ICD-10-CM | POA: Diagnosis not present

## 2017-08-08 DIAGNOSIS — Z3A36 36 weeks gestation of pregnancy: Secondary | ICD-10-CM | POA: Insufficient documentation

## 2017-08-08 DIAGNOSIS — O26893 Other specified pregnancy related conditions, third trimester: Secondary | ICD-10-CM | POA: Insufficient documentation

## 2017-08-08 LAB — POCT FERN TEST: POCT Fern Test: NEGATIVE

## 2017-08-08 LAB — WET PREP, GENITAL
CLUE CELLS WET PREP: NONE SEEN
SPERM: NONE SEEN
Trich, Wet Prep: NONE SEEN
WBC WET PREP: NONE SEEN
Yeast Wet Prep HPF POC: NONE SEEN

## 2017-08-08 NOTE — MAU Note (Signed)
Pt stepped back on the unit to use restroom, wanted to let someone know that her symptoms have changed since being triaged. States " ctx are 3-3.5 mins apart and I'm feeling constant pressure. Feels like I'm on fire down there." States pain is 7/10.  Informed Camera operator, J. Spurlock-Frizzell, of changed.

## 2017-08-08 NOTE — MAU Provider Note (Signed)
  S: Ms. Keyleigh Manninen is a 37 y.o. G2P1001 at [redacted]w[redacted]d  who presents to MAU today complaining of leaking of fluid since 1045am. She denies vaginal bleeding. She endorses contractions that started in the car on the way over here.  She reports normal fetal movement.    O: BP 123/67 (BP Location: Right Arm)   Pulse 79   Temp 97.9 F (36.6 C) (Oral)   Resp 18   Ht 5\' 6"  (1.676 m)   Wt 261 lb 4 oz (118.5 kg)   LMP 11/24/2016   SpO2 97%   BMI 42.17 kg/m  GENERAL: Well-developed, well-nourished female in no acute distress.  HEAD: Normocephalic, atraumatic.  CHEST: Normal effort of breathing, regular heart rate ABDOMEN: Soft, nontender, gravid PELVIC: Normal external female genitalia. Vagina is pink and rugated. Cervix with normal contour, no lesions. Normal discharge.  Neg pooling.   Cervical exam:   Difficult exam; cervix is long, posterior.    Fetal Monitoring: Baseline: 120 Variability: mod Accelerations: acel Decelerations: neg Contractions: q 2 min; mild to palpation  Results for orders placed or performed during the hospital encounter of 08/08/17 (from the past 24 hour(s))  POCT fern test     Status: Normal   Collection Time: 08/08/17  1:31 PM  Result Value Ref Range   POCT Fern Test Negative = intact amniotic membranes   Wet prep, genital     Status: None   Collection Time: 08/08/17  1:52 PM  Result Value Ref Range   Yeast Wet Prep HPF POC NONE SEEN NONE SEEN   Trich, Wet Prep NONE SEEN NONE SEEN   Clue Cells Wet Prep HPF POC NONE SEEN NONE SEEN   WBC, Wet Prep HPF POC NONE SEEN NONE SEEN   Sperm NONE SEEN      A: SIUP at [redacted]w[redacted]d  Membranes intact Wet prep negative P: Report given to RN to contact MD on call for further instructions  Starr [place], CNM 08/08/2017 2:42 PM

## 2017-08-08 NOTE — MAU Note (Signed)
Thinks her water may have broken, was standing and all of the sudden felt wet (1040), underwear is wet- small amt of fluid.  No bleeding.  Has had a couple of contractions, feeling some pressure.

## 2017-08-11 ENCOUNTER — Other Ambulatory Visit: Payer: Self-pay | Admitting: Obstetrics and Gynecology

## 2017-08-11 NOTE — H&P (Signed)
37 y.o.  G2P1001 102w1d comes in for a repeat cesarean section at term.  Patient has good fetal movement and no bleeding.   Past Medical History:  Diagnosis Date  . Allergy   . Anemia   . Anxiety   . Depression   . GERD (gastroesophageal reflux disease)     Past Surgical History:  Procedure Laterality Date  . CESAREAN SECTION    . NASAL SEPTOPLASTY W/ TURBINOPLASTY    . WISDOM TOOTH EXTRACTION      OB History  Gravida Para Term Preterm AB Living  2 1 1     1   SAB TAB Ectopic Multiple Live Births          1    # Outcome Date GA Lbr Len/2nd Weight Sex Delivery Anes PTL Lv  2 Current           1 Term 07/22/03 [redacted]w[redacted]d  5 lb 2 oz (2.325 kg) F CS-LTranv EPI N LIV      Social History   Socioeconomic History  . Marital status: Married    Spouse name: Not on file  . Number of children: Not on file  . Years of education: Not on file  . Highest education level: Not on file  Social Needs  . Financial resource strain: Not on file  . Food insecurity - worry: Not on file  . Food insecurity - inability: Not on file  . Transportation needs - medical: Not on file  . Transportation needs - non-medical: Not on file  Occupational History  . Not on file  Tobacco Use  . Smoking status: Former Smoker    Types: Cigarettes    Last attempt to quit: 01/06/2006    Years since quitting: 11.6  . Smokeless tobacco: Never Used  Substance and Sexual Activity  . Alcohol use: No    Alcohol/week: 1.2 - 1.8 oz    Types: 2 - 3 Glasses of wine per week    Frequency: Never    Comment: none with preg  . Drug use: No  . Sexual activity: Yes    Birth control/protection: Condom, Pill  Other Topics Concern  . Not on file  Social History Narrative  . Not on file   Penicillins and Sertraline hcl   Prenatal Course: Uncomplicated.  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    Prenatal Transfer Tool  Maternal Diabetes: No Genetic Screening: Normal Maternal Ultrasounds/Referrals: Normal- normal NIPT Fetal  Ultrasounds or other Referrals:  Referred to M[facility] Fetal Medicine  for genetic counseling regarding FOB neurofibromatosis.  Because of wide variation in genetics, parents declined further testing. Maternal Substance Abuse:  No Significant Maternal Medications:  None Significant Maternal Lab Results: None  There were no vitals filed for this visit.  Lungs/Cor:  NAD Abdomen:  soft, gravid Ex:  no cords, erythema SVE:  NA FHTs:  Present.  A/P   For repeat cesarean sectionat term.  All risks, benefits and alternatives discussed with patient and she desires to proceed.  Deforest Maiden A

## 2017-08-12 ENCOUNTER — Encounter (HOSPITAL_COMMUNITY): Payer: Self-pay

## 2017-08-13 ENCOUNTER — Encounter (HOSPITAL_COMMUNITY): Payer: Self-pay | Admitting: *Deleted

## 2017-08-13 ENCOUNTER — Encounter (HOSPITAL_COMMUNITY): Payer: Self-pay

## 2017-08-13 ENCOUNTER — Other Ambulatory Visit: Payer: Self-pay

## 2017-08-13 ENCOUNTER — Inpatient Hospital Stay (HOSPITAL_COMMUNITY)
Admission: AD | Admit: 2017-08-13 | Discharge: 2017-08-13 | Disposition: A | Discharge status: Home / Self Care | Payer: BC Managed Care – PPO | Source: Ambulatory Visit | Attending: Obstetrics and Gynecology | Admitting: Obstetrics and Gynecology

## 2017-08-13 DIAGNOSIS — Z87891 Personal history of nicotine dependence: Secondary | ICD-10-CM | POA: Diagnosis not present

## 2017-08-13 DIAGNOSIS — O479 False labor, unspecified: Secondary | ICD-10-CM

## 2017-08-13 DIAGNOSIS — Z8269 Family history of other diseases of the musculoskeletal system and connective tissue: Secondary | ICD-10-CM | POA: Diagnosis not present

## 2017-08-13 DIAGNOSIS — O368190 Decreased fetal movements, unspecified trimester, not applicable or unspecified: Secondary | ICD-10-CM

## 2017-08-13 DIAGNOSIS — Z811 Family history of alcohol abuse and dependence: Secondary | ICD-10-CM | POA: Insufficient documentation

## 2017-08-13 DIAGNOSIS — O368130 Decreased fetal movements, third trimester, not applicable or unspecified: Secondary | ICD-10-CM | POA: Diagnosis present

## 2017-08-13 DIAGNOSIS — O471 False labor at or after 37 completed weeks of gestation: Secondary | ICD-10-CM | POA: Insufficient documentation

## 2017-08-13 DIAGNOSIS — Z3A37 37 weeks gestation of pregnancy: Secondary | ICD-10-CM

## 2017-08-13 DIAGNOSIS — Z8249 Family history of ischemic heart disease and other diseases of the circulatory system: Secondary | ICD-10-CM | POA: Diagnosis not present

## 2017-08-13 DIAGNOSIS — K219 Gastro-esophageal reflux disease without esophagitis: Secondary | ICD-10-CM | POA: Insufficient documentation

## 2017-08-13 DIAGNOSIS — Z880 Allergy status to penicillin: Secondary | ICD-10-CM | POA: Insufficient documentation

## 2017-08-13 DIAGNOSIS — Z79899 Other long term (current) drug therapy: Secondary | ICD-10-CM | POA: Insufficient documentation

## 2017-08-13 DIAGNOSIS — Z888 Allergy status to other drugs, medicaments and biological substances status: Secondary | ICD-10-CM | POA: Diagnosis not present

## 2017-08-13 DIAGNOSIS — O99613 Diseases of the digestive system complicating pregnancy, third trimester: Secondary | ICD-10-CM | POA: Diagnosis not present

## 2017-08-13 DIAGNOSIS — Z7951 Long term (current) use of inhaled steroids: Secondary | ICD-10-CM | POA: Diagnosis not present

## 2017-08-13 DIAGNOSIS — Z3689 Encounter for other specified antenatal screening: Secondary | ICD-10-CM

## 2017-08-13 NOTE — MAU Provider Note (Signed)
History     CSN: [id number]  Arrival date and time: 08/13/17 1728   First Provider Initiated Contact with Patient 08/13/17 1819      Chief Complaint  Patient presents with  . Decreased Fetal Movement  . Abdominal Pain   HPI   Ms.Aleenah [name] is a 37 y.o. female G2P1001 @ [redacted]w[redacted]d here with decreased fetal movement and contractions like pain. She was seen this morning in the MAU for contractions and feels that the contractions have continued all day. They are about the same in intensity. Says her babies movements have been decreased for the last 2 hours. When the monitors were applied in MAU she immediatly started feeling her baby move normally.   OB History    Gravida Para Term Preterm AB Living   2 1 1     1    SAB TAB Ectopic Multiple Live Births           1      Past Medical History:  Diagnosis Date  . Allergy   . Anemia   . Anxiety   . Depression    wellbutrin in past  . GERD (gastroesophageal reflux disease)     Past Surgical History:  Procedure Laterality Date  . CESAREAN SECTION    . NASAL SEPTOPLASTY W/ TURBINOPLASTY    . WISDOM TOOTH EXTRACTION      Family History  Problem Relation Age of Onset  . Heart attack Father   . Hyperlipidemia Father   . Hypertension Father   . Fibromyalgia Father   . Alcohol abuse Father   . Hyperlipidemia Maternal Grandmother   . Hypertension Maternal Grandmother   . Stroke Maternal Grandmother   . Osteoporosis Maternal Grandmother   . Diabetes Maternal Grandfather   . Hyperlipidemia Maternal Grandfather   . Hypertension Maternal Grandfather   . Hypertension Paternal Grandmother   . Arthritis Paternal Grandmother   . Cancer Paternal Grandmother   . Cancer - Other Paternal Grandmother   . Fibromyalgia Mother   . Melanoma Mother   . Lupus Sister   . Sudden death Neg Hx     Social History   Tobacco Use  . Smoking status: Former Smoker    Types: Cigarettes    Last attempt to quit: 01/06/2006    Years since  quitting: 11.6  . Smokeless tobacco: Never Used  Substance Use Topics  . Alcohol use: No    Alcohol/week: 1.2 - 1.8 oz    Types: 2 - 3 Glasses of wine per week    Frequency: Never    Comment: none with preg  . Drug use: No    Allergies:  Allergies  Allergen Reactions  . Penicillins Hives    Has patient had a PCN reaction causing immediate rash, facial/tongue/throat swelling, SOB or lightheadedness with hypotension: unknown Has patient had a PCN reaction causing severe rash involving mucus membranes or skin necrosis: unknown Has patient had a PCN reaction that required hospitalization: no  Has patient had a PCN reaction occurring within the last 10 years: no If all of the above answers are "NO", then may proceed with Cephalosporin use.   . Sertraline Hcl     [place]     Medications Prior to Admission  Medication Sig Dispense Refill Last Dose  . acetaminophen (TYLENOL) 500 MG tablet Take 500 mg by mouth every 6 (six) hours as needed for moderate pain.   Past Week at Unknown time  . calcium carbonate (TUMS - DOSED IN MG ELEMENTAL CALCIUM)  500 MG chewable tablet Chew 1 tablet by mouth 3 (three) times daily as needed for indigestion or heartburn.   Past Week at Unknown time  . cetirizine (ZYRTEC) 10 MG tablet Take 10 mg by mouth daily.   08/08/2017 at Unknown time  . Doxylamine Succinate, Sleep, (UNISOM PO) Take 1 tablet by mouth at bedtime.    Past Month at Unknown time  . fluticasone (FLONASE) 50 MCG/ACT nasal spray Place 2 sprays into both nostrils daily. (Patient taking differently: Place 1 spray into both nostrils daily. ) 16 g 6 08/08/2017 at Unknown time  . omeprazole (PRILOSEC) 20 MG capsule Take 20 mg by mouth daily.     08/08/2017 at Unknown time  . Prenatal Vit-Fe Fumarate-FA (PRENATAL VITAMIN PO) Take 1 tablet by mouth daily.    08/08/2017 at Unknown time  . Pyridoxine HCl (VITAMIN B6 PO) Take 1 tablet by mouth at bedtime.    08/08/2017 at Unknown time   No results found for this  or any previous visit (from the past 48 hour(s)).  Review of Systems  Gastrointestinal: Positive for abdominal pain (Contraction like pain ).  Genitourinary: Negative for vaginal bleeding and vaginal discharge.   Physical Exam   Blood pressure 116/76, pulse 87, temperature 97.8 F (36.6 C), temperature source Oral, resp. rate 16, height 5\' 6"  (1.676 m), weight 260 lb (117.9 kg), last menstrual period 11/24/2016, SpO2 98 %.  Physical Exam  Constitutional: She is oriented to person, place, and time. She appears well-developed and well-nourished. No distress.  HENT:  Head: Normocephalic.  Eyes: Pupils are equal, round, and reactive to light.  GI: Soft. She exhibits no distension. There is no tenderness. There is no rebound.  Genitourinary:  Genitourinary Comments: Dilation: Closed Effacement (%): Thick Cervical Position: Posterior Exam by:: Noni Saupe, NP  Musculoskeletal: Normal range of motion.  Neurological: She is alert and oriented to person, place, and time.  Skin: Skin is warm. She is not diaphoretic.  Psychiatric: Her behavior is normal.   Fetal Tracing: Baseline: 130 bpm Variability: Moderate  Accelerations: 15x15 Decelerations: None Toco: Irregular pattern   MAU Course  Procedures  None  MDM + fetal movement  Discussed with Dr. Rogue Bussing, ok for DC   Assessment and Plan   A:  1. Decreased fetal movement during pregnancy, antepartum, single or unspecified fetus   2. NST (non-stress test) reactive   3. False labor     P:  Discharge home with strict return precautions Labor precautions Kick counts at home Return to MAU if symptoms worsen Follow up with OB as scheduled or sooner if needed  Lezlie Lye, NP 08/13/2017 7:47 PM

## 2017-08-13 NOTE — MAU Note (Signed)
Pt reports she was seen here this am for abd pain and was discharged home at 0530. States she feels like her stomach is still getting rigid and she has not felt the baby move for the last 2.5 hours.

## 2017-08-13 NOTE — Progress Notes (Signed)
Discussed discharge instructions with patient and FOB.  Discussed fetal kick counts and when to call the MD/come back to MAU if patient concerned about contraction that does not go away.  Pt verbalized understanding.  Dc'd home in good condition.

## 2017-08-13 NOTE — MAU Note (Signed)
Pt states she woke up at [place] and was drenched in sweat and was in pain.   Denies LOF, Denies vaginal bleeding Reports good fetal movement

## 2017-08-13 NOTE — ED Provider Notes (Signed)
Pt is a 37 y/o white female, G2P1001 at 98 1/2 wks who presented to the ER with an onset of constant severe LAP . After arrival t the ER the pain decrease and became rhythmic. She was found to have ctxs q 3-6 min.  Her cx was closed. She is not having any bleeding. The FHTs are reactive. She is scheduled for a repeat c/s in 2 wks. She was observed for labor an made no change in her cx over 4 hours.  IMP/ Contractions, no labor Plan/ Will discharge to  Home and follow up in labor           Pt told to call with bleeding , decreased FM, ROM, more painful ctxs

## 2017-08-13 NOTE — Discharge Instructions (Signed)
Braxton Hicks Contractions °Contractions of the uterus can occur throughout pregnancy, but they are not always a sign that you are in labor. You may have practice contractions called Braxton Hicks contractions. These false labor contractions are sometimes confused with true labor. °What are Braxton Hicks contractions? °Braxton Hicks contractions are tightening movements that occur in the muscles of the uterus before labor. Unlike true labor contractions, these contractions do not result in opening (dilation) and thinning of the cervix. Toward the end of pregnancy (32-34 weeks), Braxton Hicks contractions can happen more often and may become stronger. These contractions are sometimes difficult to tell apart from true labor because they can be very uncomfortable. You should not feel embarrassed if you go to the hospital with false labor. °Sometimes, the only way to tell if you are in true labor is for your health care provider to look for changes in the cervix. The health care provider will do a physical exam and may monitor your contractions. If you are not in true labor, the exam should show that your cervix is not dilating and your water has not broken. °If there are other health problems associated with your pregnancy, it is completely safe for you to be sent home with false labor. You may continue to have Braxton Hicks contractions until you go into true labor. °How to tell the difference between true labor and false labor °True labor °· Contractions last 30-70 seconds. °· Contractions become very regular. °· Discomfort is usually felt in the top of the uterus, and it spreads to the lower abdomen and low back. °· Contractions do not go away with walking. °· Contractions usually become more intense and increase in frequency. °· The cervix dilates and gets thinner. °False labor °· Contractions are usually shorter and not as strong as true labor contractions. °· Contractions are usually irregular. °· Contractions  are often felt in the front of the lower abdomen and in the groin. °· Contractions may go away when you walk around or change positions while lying down. °· Contractions get weaker and are shorter-lasting as time goes on. °· The cervix usually does not dilate or become thin. °Follow these instructions at home: °· Take over-the-counter and prescription medicines only as told by your health care provider. °· Keep up with your usual exercises and follow other instructions from your health care provider. °· Eat and drink lightly if you think you are going into labor. °· If Braxton Hicks contractions are making you uncomfortable: °? Change your position from lying down or resting to walking, or change from walking to resting. °? Sit and rest in a tub of warm water. °? Drink enough fluid to keep your urine pale yellow. Dehydration may cause these contractions. °? Do slow and deep breathing several times an hour. °· Keep all follow-up prenatal visits as told by your health care provider. This is important. °Contact a health care provider if: °· You have a fever. °· You have continuous pain in your abdomen. °Get help right away if: °· Your contractions become stronger, more regular, and closer together. °· You have fluid leaking or gushing from your vagina. °· You pass blood-tinged mucus (bloody show). °· You have bleeding from your vagina. °· You have low back pain that you never had before. °· You feel your baby’s head pushing down and causing pelvic pressure. °· Your baby is not moving inside you as much as it used to. °Summary °· Contractions that occur before labor are called Braxton   Hicks contractions, false labor, or practice contractions. °· Braxton Hicks contractions are usually shorter, weaker, farther apart, and less regular than true labor contractions. True labor contractions usually become progressively stronger and regular and they become more frequent. °· Manage discomfort from Braxton Hicks contractions by  changing position, resting in a warm bath, drinking plenty of water, or practicing deep breathing. °This information is not intended to replace advice given to you by your health care provider. Make sure you discuss any questions you have with your health care provider. °Document Released: 10/17/2016 Document Revised: 10/17/2016 Document Reviewed: 10/17/2016 °Elsevier Interactive Patient Education © 2018 [employer] ° °Fetal Movement Counts °Patient Name: ________________________________________________ Patient Due Date: ____________________ °What is a fetal movement count? °A fetal movement count is the number of times that you feel your baby move during a certain amount of time. This may also be called a fetal kick count. A fetal movement count is recommended for every pregnant woman. You may be asked to start counting fetal movements as early as week 28 of your pregnancy. °Pay attention to when your baby is most active. You may notice your baby's sleep and wake cycles. You may also notice things that make your baby move more. You should do a fetal movement count: °· When your baby is normally most active. °· At the same time each day. ° °A good time to count movements is while you are resting, after having something to eat and drink. °How do I count fetal movements? °1. Find a quiet, comfortable area. Sit, or lie down on your side. °2. Write down the date, the start time and stop time, and the number of movements that you felt between those two times. Take this information with you to your health care visits. °3. For 2 hours, count kicks, flutters, swishes, rolls, and jabs. You should feel at least 10 movements during 2 hours. °4. You may stop counting after you have felt 10 movements. °5. If you do not feel 10 movements in 2 hours, have something to eat and drink. Then, keep resting and counting for 1 hour. If you feel at least 4 movements during that hour, you may stop counting. °Contact a health care  provider if: °· You feel fewer than 4 movements in 2 hours. °· Your baby is not moving like he or she usually does. °Date: ____________ Start time: ____________ Stop time: ____________ Movements: ____________ °Date: ____________ Start time: ____________ Stop time: ____________ Movements: ____________ °Date: ____________ Start time: ____________ Stop time: ____________ Movements: ____________ °Date: ____________ Start time: ____________ Stop time: ____________ Movements: ____________ °Date: ____________ Start time: ____________ Stop time: ____________ Movements: ____________ °Date: ____________ Start time: ____________ Stop time: ____________ Movements: ____________ °Date: ____________ Start time: ____________ Stop time: ____________ Movements: ____________ °Date: ____________ Start time: ____________ Stop time: ____________ Movements: ____________ °Date: ____________ Start time: ____________ Stop time: ____________ Movements: ____________ °This information is not intended to replace advice given to you by your health care provider. Make sure you discuss any questions you have with your health care provider. °Document Released: 07/03/2006 Document Revised: 01/31/2016 Document Reviewed: 07/13/2015 °Elsevier Interactive Patient Education © 2018 [employer] ° °

## 2017-08-13 NOTE — Discharge Instructions (Signed)
Nonstress Test The nonstress test is a procedure that monitors the fetus's heartbeat. The test will monitor the heartbeat when the fetus is at rest and while the fetus is moving. In a healthy fetus, there will be an increase in fetal heart rate when the fetus moves or kicks. The heart rate will decrease at rest. This test helps determine if the fetus is healthy. Your health care provider will look at a number of patterns in the heart rate tracing to make sure your baby is thriving. If there is concern, your health care provider may order additional tests or may suggest another course of action. This test is often done in the third trimester and can help determine if an early delivery is needed and safe. Common reasons to have this test are:  You are past your due date.  You have a high-risk pregnancy.  You are feeling less movement than normal.  You have lost a pregnancy in the past.  Your health care provider suspects fetal growth problems.  You have too much or too little amniotic fluid.  What happens before the procedure?  Eat a meal right before the test or as directed by your health care provider. Food may help stimulate fetal movements.  Use the restroom right before the test. What happens during the procedure?  Two belts will be placed around your abdomen. These belts have monitors attached to them. One records the fetal heart rate and the other records uterine contractions.  You may be asked to lie down on your side or to stay sitting upright.  You may be given a button to press when you feel movement.  The fetal heartbeat is listened to and watched on a screen. The heartbeat is recorded on a sheet of paper.  If the fetus seems to be sleeping, you may be asked to drink some juice or soda, gently press your abdomen, or make some noise to wake the fetus. What happens after the procedure? Your health care provider will discuss the test results with you and make recommendations  for the near future.  This information is not intended to replace advice given to you by your health care provider. Make sure you discuss any questions you have with your health care provider. This information is not intended to replace advice given to you by your health care provider. Make sure you discuss any questions you have with your health care provider. Document Released: 05/24/2002 Document Revised: 05/03/2016 Document Reviewed: 07/07/2012 Elsevier Interactive Patient Education  2018 [employer] Inc.  

## 2017-08-14 ENCOUNTER — Telehealth (HOSPITAL_COMMUNITY): Payer: Self-pay | Admitting: *Deleted

## 2017-08-14 NOTE — Telephone Encounter (Signed)
Preadmission screen  

## 2017-08-15 ENCOUNTER — Telehealth (HOSPITAL_COMMUNITY): Payer: Self-pay | Admitting: *Deleted

## 2017-08-15 NOTE — Telephone Encounter (Signed)
Preadmission screen  

## 2017-08-18 ENCOUNTER — Encounter (HOSPITAL_COMMUNITY): Payer: Self-pay

## 2017-08-21 ENCOUNTER — Other Ambulatory Visit: Payer: Self-pay | Admitting: Obstetrics and Gynecology

## 2017-08-21 NOTE — Patient Instructions (Signed)
Teresa [name]  08/21/2017   Your procedure is scheduled on:  08/24/2017  Enter through the Main Entrance of [facility] up the phone at the desk and dial [phone]  Call this number if you have problems the morning of surgery:[phone]  Remember:   Do not eat food:(After Midnight) Desps de medianoche.  Do not drink clear liquids: (After Midnight) Desps de medianoche.  Take these medicines the morning of surgery with A SIP OF WATER: may take prilosec   Do not wear jewelry, make-up or nail polish.  Do not wear lotions, powders, or perfumes. Do not wear deodorant.  Do not shave 48 hours prior to surgery.  Do not bring valuables to the hospital.  [facility] is not   responsible for any belongings or valuables brought to the hospital.  Contacts, dentures or bridgework may not be worn into surgery.  Leave suitcase in the car. After surgery it may be brought to your room.  For patients admitted to the hospital, checkout time is 11:00 AM the day of              discharge.    N/A   Please read over the following fact sheets that you were given:   Surgical Site Infection Prevention

## 2017-08-22 ENCOUNTER — Encounter (HOSPITAL_COMMUNITY)
Admission: RE | Admit: 2017-08-22 | Discharge: 2017-08-22 | Disposition: A | Discharge status: Home / Self Care | Payer: BC Managed Care – PPO | Source: Ambulatory Visit | Attending: Obstetrics and Gynecology | Admitting: Obstetrics and Gynecology

## 2017-08-22 HISTORY — DX: Other specified postprocedural states: Z98.890

## 2017-08-22 HISTORY — DX: Adverse effect of unspecified anesthetic, initial encounter: T41.45XA

## 2017-08-22 HISTORY — DX: Nausea with vomiting, unspecified: R11.2

## 2017-08-22 HISTORY — DX: Other complications of anesthesia, initial encounter: T88.59XA

## 2017-08-22 LAB — TYPE AND SCREEN
ABO/RH(D): A POS
Antibody Screen: NEGATIVE

## 2017-08-22 LAB — CBC
HEMATOCRIT: 33.1 % — AB (ref 36.0–46.0)
Hemoglobin: 11.3 g/dL — ABNORMAL LOW (ref 12.0–15.0)
MCH: 30.5 pg (ref 26.0–34.0)
MCHC: 34.1 g/dL (ref 30.0–36.0)
MCV: 89.2 fL (ref 78.0–100.0)
PLATELETS: 244 10*3/uL (ref 150–400)
RBC: 3.71 MIL/uL — AB (ref 3.87–5.11)
RDW: 13 % (ref 11.5–15.5)
WBC: 9.8 10*3/uL (ref 4.0–10.5)

## 2017-08-22 LAB — ABO/RH: ABO/RH(D): A POS

## 2017-08-23 LAB — RPR: RPR: NONREACTIVE

## 2017-08-24 ENCOUNTER — Encounter (HOSPITAL_COMMUNITY): Payer: Self-pay | Admitting: *Deleted

## 2017-08-24 ENCOUNTER — Other Ambulatory Visit: Payer: Self-pay

## 2017-08-24 ENCOUNTER — Inpatient Hospital Stay (HOSPITAL_COMMUNITY): Payer: BC Managed Care – PPO | Admitting: Certified Registered Nurse Anesthetist

## 2017-08-24 ENCOUNTER — Inpatient Hospital Stay (HOSPITAL_COMMUNITY)
Admission: RE | Admit: 2017-08-24 | Discharge: 2017-08-26 | DRG: 788 | Disposition: A | Discharge status: Home / Self Care | Payer: BC Managed Care – PPO | Source: Ambulatory Visit | Attending: Obstetrics and Gynecology | Admitting: Obstetrics and Gynecology

## 2017-08-24 ENCOUNTER — Encounter (HOSPITAL_COMMUNITY)
Admission: RE | Disposition: A | Discharge status: Home / Self Care | Payer: Self-pay | Source: Ambulatory Visit | Attending: Obstetrics and Gynecology

## 2017-08-24 DIAGNOSIS — Z9889 Other specified postprocedural states: Secondary | ICD-10-CM

## 2017-08-24 DIAGNOSIS — Z3A37 37 weeks gestation of pregnancy: Secondary | ICD-10-CM | POA: Diagnosis not present

## 2017-08-24 DIAGNOSIS — Z87891 Personal history of nicotine dependence: Secondary | ICD-10-CM

## 2017-08-24 DIAGNOSIS — Z880 Allergy status to penicillin: Secondary | ICD-10-CM

## 2017-08-24 DIAGNOSIS — D649 Anemia, unspecified: Secondary | ICD-10-CM | POA: Diagnosis present

## 2017-08-24 DIAGNOSIS — O99214 Obesity complicating childbirth: Secondary | ICD-10-CM | POA: Diagnosis present

## 2017-08-24 DIAGNOSIS — O9902 Anemia complicating childbirth: Secondary | ICD-10-CM | POA: Diagnosis present

## 2017-08-24 DIAGNOSIS — O34211 Maternal care for low transverse scar from previous cesarean delivery: Secondary | ICD-10-CM | POA: Diagnosis present

## 2017-08-24 SURGERY — Surgical Case
Anesthesia: Spinal | Site: Abdomen | Wound class: Clean Contaminated

## 2017-08-24 MED ORDER — FLEET ENEMA 7-19 GM/118ML RE ENEM: 1.0000 | ENEMA | Freq: Every day | RECTAL | Status: DC | PRN

## 2017-08-24 MED ORDER — SIMETHICONE 80 MG PO CHEW
80.0000 mg | CHEWABLE_TABLET | Freq: Three times a day (TID) | ORAL | Status: DC
  Administered 2017-08-24 – 2017-08-26 (×6): 80 mg via ORAL
  Filled 2017-08-24 (×6): qty 1

## 2017-08-24 MED ORDER — OXYTOCIN 10 UNIT/ML IJ SOLN
INTRAMUSCULAR | Status: AC
  Filled 2017-08-24: qty 4

## 2017-08-24 MED ORDER — DIPHENHYDRAMINE HCL 50 MG/ML IJ SOLN: 12.5000 mg | INTRAMUSCULAR | Status: DC | PRN

## 2017-08-24 MED ORDER — ONDANSETRON HCL 4 MG/2ML IJ SOLN
INTRAMUSCULAR | Status: AC
  Filled 2017-08-24: qty 2

## 2017-08-24 MED ORDER — LACTATED RINGERS IV SOLN: INTRAVENOUS | Status: DC

## 2017-08-24 MED ORDER — METHYLERGONOVINE MALEATE 0.2 MG/ML IJ SOLN: 0.2000 mg | INTRAMUSCULAR | Status: DC | PRN

## 2017-08-24 MED ORDER — MORPHINE SULFATE (PF) 0.5 MG/ML IJ SOLN
INTRAMUSCULAR | Status: AC
  Filled 2017-08-24: qty 10

## 2017-08-24 MED ORDER — PHENYLEPHRINE HCL 10 MG/ML IJ SOLN
INTRAMUSCULAR | Status: DC | PRN
Start: 2017-08-24 — End: 2017-08-24
  Administered 2017-08-24: 80 ug via INTRAVENOUS
  Administered 2017-08-24: 40 ug via INTRAVENOUS

## 2017-08-24 MED ORDER — NALOXONE HCL 4 MG/10ML IJ SOLN: 1.0000 ug/kg/h | INTRAVENOUS | Status: DC | PRN

## 2017-08-24 MED ORDER — MENTHOL 3 MG MT LOZG: 1.0000 | LOZENGE | OROMUCOSAL | Status: DC | PRN

## 2017-08-24 MED ORDER — PROMETHAZINE HCL 25 MG/ML IJ SOLN: 6.2500 mg | INTRAMUSCULAR | Status: DC | PRN

## 2017-08-24 MED ORDER — SIMETHICONE 80 MG PO CHEW
80.0000 mg | CHEWABLE_TABLET | ORAL | Status: DC
  Administered 2017-08-25 – 2017-08-26 (×2): 80 mg via ORAL
  Filled 2017-08-24 (×2): qty 1

## 2017-08-24 MED ORDER — LORATADINE 10 MG PO TABS
10.0000 mg | ORAL_TABLET | Freq: Every day | ORAL | Status: DC
  Administered 2017-08-25 – 2017-08-26 (×2): 10 mg via ORAL
  Filled 2017-08-24 (×2): qty 1

## 2017-08-24 MED ORDER — GENTAMICIN SULFATE 40 MG/ML IJ SOLN
INTRAVENOUS | Status: AC
  Administered 2017-08-24: 100 mL via INTRAVENOUS
  Filled 2017-08-24: qty 10.25

## 2017-08-24 MED ORDER — BISACODYL 10 MG RE SUPP: 10.0000 mg | Freq: Every day | RECTAL | Status: DC | PRN

## 2017-08-24 MED ORDER — PHENYLEPHRINE 40 MCG/ML (10ML) SYRINGE FOR IV PUSH (FOR BLOOD PRESSURE SUPPORT)
PREFILLED_SYRINGE | INTRAVENOUS | Status: AC
  Filled 2017-08-24: qty 10

## 2017-08-24 MED ORDER — TETANUS-DIPHTH-ACELL PERTUSSIS 5-2.5-18.5 LF-MCG/0.5 IM SUSP: 0.5000 mL | Freq: Once | INTRAMUSCULAR | Status: DC

## 2017-08-24 MED ORDER — ZOLPIDEM TARTRATE 5 MG PO TABS: 5.0000 mg | ORAL_TABLET | Freq: Every evening | ORAL | Status: DC | PRN

## 2017-08-24 MED ORDER — NALBUPHINE HCL 10 MG/ML IJ SOLN: 5.0000 mg | Freq: Once | INTRAMUSCULAR | Status: DC | PRN

## 2017-08-24 MED ORDER — OXYCODONE HCL 5 MG PO TABS: 5.0000 mg | ORAL_TABLET | Freq: Once | ORAL | Status: DC | PRN

## 2017-08-24 MED ORDER — COCONUT OIL OIL
1.0000 "application " | TOPICAL_OIL | Status: DC | PRN
  Administered 2017-08-26: 1 via TOPICAL
  Filled 2017-08-24: qty 120

## 2017-08-24 MED ORDER — DIPHENHYDRAMINE HCL 25 MG PO CAPS: 25.0000 mg | ORAL_CAPSULE | ORAL | Status: DC | PRN

## 2017-08-24 MED ORDER — OXYCODONE HCL 5 MG PO TABS
5.0000 mg | ORAL_TABLET | ORAL | Status: DC | PRN
  Administered 2017-08-25 – 2017-08-26 (×4): 5 mg via ORAL
  Filled 2017-08-24 (×3): qty 1

## 2017-08-24 MED ORDER — DIBUCAINE 1 % RE OINT: 1.0000 "application " | TOPICAL_OINTMENT | RECTAL | Status: DC | PRN

## 2017-08-24 MED ORDER — SCOPOLAMINE 1 MG/3DAYS TD PT72: 1.0000 | MEDICATED_PATCH | Freq: Once | TRANSDERMAL | Status: DC

## 2017-08-24 MED ORDER — BUPIVACAINE IN DEXTROSE 0.75-8.25 % IT SOLN
INTRATHECAL | Status: AC
  Filled 2017-08-24: qty 2

## 2017-08-24 MED ORDER — MEPERIDINE HCL 25 MG/ML IJ SOLN: 6.2500 mg | INTRAMUSCULAR | Status: DC | PRN

## 2017-08-24 MED ORDER — MEASLES, MUMPS & RUBELLA VAC ~~LOC~~ INJ: 0.5000 mL | INJECTION | Freq: Once | SUBCUTANEOUS | Status: DC

## 2017-08-24 MED ORDER — SCOPOLAMINE 1 MG/3DAYS TD PT72
MEDICATED_PATCH | TRANSDERMAL | Status: DC | PRN
  Administered 2017-08-24: 1 via TRANSDERMAL

## 2017-08-24 MED ORDER — PRENATAL MULTIVITAMIN CH
1.0000 | ORAL_TABLET | Freq: Every day | ORAL | Status: DC
  Administered 2017-08-24 – 2017-08-25 (×2): 1 via ORAL
  Filled 2017-08-24 (×2): qty 1

## 2017-08-24 MED ORDER — SIMETHICONE 80 MG PO CHEW: 80.0000 mg | CHEWABLE_TABLET | ORAL | Status: DC | PRN

## 2017-08-24 MED ORDER — NALOXONE HCL 0.4 MG/ML IJ SOLN: 0.4000 mg | INTRAMUSCULAR | Status: DC | PRN

## 2017-08-24 MED ORDER — MORPHINE SULFATE (PF) 0.5 MG/ML IJ SOLN
INTRAMUSCULAR | Status: DC | PRN
  Administered 2017-08-24: .2 mg via INTRATHECAL

## 2017-08-24 MED ORDER — LACTATED RINGERS IV SOLN
INTRAVENOUS | Status: DC | PRN
  Administered 2017-08-24: 08:00:00 via INTRAVENOUS

## 2017-08-24 MED ORDER — BUPIVACAINE IN DEXTROSE 0.75-8.25 % IT SOLN
INTRATHECAL | Status: DC | PRN
  Administered 2017-08-24: 1.6 mL via INTRATHECAL

## 2017-08-24 MED ORDER — NALBUPHINE HCL 10 MG/ML IJ SOLN: 5.0000 mg | INTRAMUSCULAR | Status: DC | PRN

## 2017-08-24 MED ORDER — METHYLERGONOVINE MALEATE 0.2 MG PO TABS: 0.2000 mg | ORAL_TABLET | ORAL | Status: DC | PRN

## 2017-08-24 MED ORDER — OXYCODONE HCL 5 MG PO TABS
10.0000 mg | ORAL_TABLET | ORAL | Status: DC | PRN
  Administered 2017-08-25: 10 mg via ORAL
  Filled 2017-08-24 (×2): qty 2

## 2017-08-24 MED ORDER — HYDROMORPHONE HCL 1 MG/ML IJ SOLN
INTRAMUSCULAR | Status: AC
  Filled 2017-08-24: qty 0.5

## 2017-08-24 MED ORDER — DEXAMETHASONE SODIUM PHOSPHATE 10 MG/ML IJ SOLN
INTRAMUSCULAR | Status: DC | PRN
Start: 2017-08-24 — End: 2017-08-24
  Administered 2017-08-24: 4 mg via INTRAVENOUS

## 2017-08-24 MED ORDER — SENNOSIDES-DOCUSATE SODIUM 8.6-50 MG PO TABS
2.0000 | ORAL_TABLET | ORAL | Status: DC
  Administered 2017-08-25 – 2017-08-26 (×2): 2 via ORAL
  Filled 2017-08-24 (×2): qty 2

## 2017-08-24 MED ORDER — FENTANYL CITRATE (PF) 100 MCG/2ML IJ SOLN
INTRAMUSCULAR | Status: DC | PRN
  Administered 2017-08-24: 10 ug via INTRATHECAL

## 2017-08-24 MED ORDER — IBUPROFEN 800 MG PO TABS
800.0000 mg | ORAL_TABLET | Freq: Three times a day (TID) | ORAL | Status: DC
  Administered 2017-08-24 – 2017-08-26 (×6): 800 mg via ORAL
  Filled 2017-08-24 (×7): qty 1

## 2017-08-24 MED ORDER — WITCH HAZEL-GLYCERIN EX PADS: 1.0000 "application " | MEDICATED_PAD | CUTANEOUS | Status: DC | PRN

## 2017-08-24 MED ORDER — OXYCODONE HCL 5 MG/5ML PO SOLN: 5.0000 mg | Freq: Once | ORAL | Status: DC | PRN

## 2017-08-24 MED ORDER — SCOPOLAMINE 1 MG/3DAYS TD PT72
MEDICATED_PATCH | TRANSDERMAL | Status: AC
  Filled 2017-08-24: qty 1

## 2017-08-24 MED ORDER — FLUTICASONE PROPIONATE 50 MCG/ACT NA SUSP
1.0000 | Freq: Every day | NASAL | Status: DC
  Administered 2017-08-25 – 2017-08-26 (×2): 1 via NASAL
  Filled 2017-08-24: qty 16

## 2017-08-24 MED ORDER — PANTOPRAZOLE SODIUM 40 MG PO TBEC
40.0000 mg | DELAYED_RELEASE_TABLET | Freq: Every day | ORAL | Status: DC
  Administered 2017-08-25 – 2017-08-26 (×2): 40 mg via ORAL
  Filled 2017-08-24 (×2): qty 1

## 2017-08-24 MED ORDER — OXYTOCIN 40 UNITS IN LACTATED RINGERS INFUSION - SIMPLE MED: 2.5000 [IU]/h | INTRAVENOUS | Status: AC

## 2017-08-24 MED ORDER — SODIUM CHLORIDE 0.9% FLUSH: 3.0000 mL | INTRAVENOUS | Status: DC | PRN

## 2017-08-24 MED ORDER — PHENYLEPHRINE 8 MG IN D5W 100 ML (0.08MG/ML) PREMIX OPTIME
INJECTION | INTRAVENOUS | Status: AC
  Filled 2017-08-24: qty 100

## 2017-08-24 MED ORDER — ONDANSETRON HCL 4 MG/2ML IJ SOLN
INTRAMUSCULAR | Status: DC | PRN
  Administered 2017-08-24: 4 mg via INTRAVENOUS

## 2017-08-24 MED ORDER — ACETAMINOPHEN 500 MG PO TABS
1000.0000 mg | ORAL_TABLET | Freq: Three times a day (TID) | ORAL | Status: AC
  Administered 2017-08-24 – 2017-08-25 (×2): 1000 mg via ORAL
  Filled 2017-08-24 (×3): qty 2

## 2017-08-24 MED ORDER — KETOROLAC TROMETHAMINE 30 MG/ML IJ SOLN
30.0000 mg | Freq: Once | INTRAMUSCULAR | Status: DC | PRN
  Administered 2017-08-24: 30 mg via INTRAVENOUS

## 2017-08-24 MED ORDER — KETOROLAC TROMETHAMINE 30 MG/ML IJ SOLN
INTRAMUSCULAR | Status: AC
  Filled 2017-08-24: qty 1

## 2017-08-24 MED ORDER — ONDANSETRON HCL 4 MG/2ML IJ SOLN: 4.0000 mg | Freq: Three times a day (TID) | INTRAMUSCULAR | Status: DC | PRN

## 2017-08-24 MED ORDER — LACTATED RINGERS IV SOLN
INTRAVENOUS | Status: DC
  Administered 2017-08-24 (×3): via INTRAVENOUS

## 2017-08-24 MED ORDER — FERROUS SULFATE 325 (65 FE) MG PO TABS
325.0000 mg | ORAL_TABLET | Freq: Two times a day (BID) | ORAL | Status: DC
  Administered 2017-08-24 – 2017-08-26 (×4): 325 mg via ORAL
  Filled 2017-08-24 (×4): qty 1

## 2017-08-24 MED ORDER — DIPHENHYDRAMINE HCL 25 MG PO CAPS: 25.0000 mg | ORAL_CAPSULE | Freq: Four times a day (QID) | ORAL | Status: DC | PRN

## 2017-08-24 MED ORDER — FENTANYL CITRATE (PF) 100 MCG/2ML IJ SOLN
INTRAMUSCULAR | Status: AC
  Filled 2017-08-24: qty 2

## 2017-08-24 MED ORDER — ACETAMINOPHEN 325 MG PO TABS
650.0000 mg | ORAL_TABLET | ORAL | Status: DC | PRN
  Administered 2017-08-26: 650 mg via ORAL
  Filled 2017-08-24: qty 2

## 2017-08-24 MED ORDER — OXYTOCIN 10 UNIT/ML IJ SOLN
INTRAVENOUS | Status: DC | PRN
  Administered 2017-08-24: 40 [IU] via INTRAVENOUS

## 2017-08-24 MED ORDER — PHENYLEPHRINE 8 MG IN D5W 100 ML (0.08MG/ML) PREMIX OPTIME
INJECTION | INTRAVENOUS | Status: DC | PRN
  Administered 2017-08-24: 60 ug/min via INTRAVENOUS

## 2017-08-24 MED ORDER — HYDROMORPHONE HCL 1 MG/ML IJ SOLN
0.2500 mg | INTRAMUSCULAR | Status: DC | PRN
  Administered 2017-08-24: 0.5 mg via INTRAVENOUS
  Administered 2017-08-24 (×2): 0.25 mg via INTRAVENOUS

## 2017-08-24 SURGICAL SUPPLY — 31 items
BENZOIN TINCTURE PRP APPL 2/3 (GAUZE/BANDAGES/DRESSINGS) ×3 IMPLANT
CHLORAPREP W/TINT 26ML (MISCELLANEOUS) ×3 IMPLANT
CLAMP CORD UMBIL (MISCELLANEOUS) IMPLANT
CLOSURE WOUND 1/2 X4 (GAUZE/BANDAGES/DRESSINGS) ×1
CLOTH BEACON ORANGE TIMEOUT ST (SAFETY) ×3 IMPLANT
DRSG OPSITE POSTOP 4X10 (GAUZE/BANDAGES/DRESSINGS) ×3 IMPLANT
ELECT REM PT RETURN 9FT ADLT (ELECTROSURGICAL) ×3
ELECTRODE REM PT RTRN 9FT ADLT (ELECTROSURGICAL) ×1 IMPLANT
EXTRACTOR VACUUM BELL STYLE (SUCTIONS) IMPLANT
GLOVE BIO SURGEON STRL SZ7 (GLOVE) ×3 IMPLANT
GLOVE BIOGEL PI IND STRL 7.0 (GLOVE) ×1 IMPLANT
GLOVE BIOGEL PI INDICATOR 7.0 (GLOVE) ×2
GOWN STRL REUS W/TWL LRG LVL3 (GOWN DISPOSABLE) ×6 IMPLANT
KIT ABG SYR 3ML LUER SLIP (SYRINGE) IMPLANT
NEEDLE HYPO 25X5/8 SAFETYGLIDE (NEEDLE) IMPLANT
NS IRRIG 1000ML POUR BTL (IV SOLUTION) ×3 IMPLANT
PACK C SECTION WH (CUSTOM PROCEDURE TRAY) ×3 IMPLANT
PAD OB MATERNITY 4.3X12.25 (PERSONAL CARE ITEMS) ×3 IMPLANT
PENCIL SMOKE EVAC W/HOLSTER (ELECTROSURGICAL) ×3 IMPLANT
RTRCTR C-SECT PINK 25CM LRG (MISCELLANEOUS) ×3 IMPLANT
STRIP CLOSURE SKIN 1/2X4 (GAUZE/BANDAGES/DRESSINGS) ×2 IMPLANT
SUT MNCRL 0 VIOLET CTX 36 (SUTURE) ×2 IMPLANT
SUT MONOCRYL 0 CTX 36 (SUTURE) ×4
SUT PLAIN 2 0 XLH (SUTURE) IMPLANT
SUT VIC AB 0 CT1 27 (SUTURE) ×4
SUT VIC AB 0 CT1 27XBRD ANBCTR (SUTURE) ×2 IMPLANT
SUT VIC AB 2-0 CT1 27 (SUTURE) ×2
SUT VIC AB 2-0 CT1 TAPERPNT 27 (SUTURE) ×1 IMPLANT
SUT VIC AB 4-0 KS 27 (SUTURE) ×3 IMPLANT
TOWEL OR 17X24 6PK STRL BLUE (TOWEL DISPOSABLE) ×3 IMPLANT
TRAY FOLEY BAG SILVER LF 14FR (SET/KITS/TRAYS/PACK) ×3 IMPLANT

## 2017-08-24 NOTE — Anesthesia Preprocedure Evaluation (Signed)
Anesthesia Evaluation  Patient identified by MRN, date of birth, ID band Patient awake    Reviewed: Allergy & Precautions, NPO status , Patient's Chart, lab work & pertinent test results  Airway Mallampati: II  TM Distance: >3 FB Neck ROM: Full    Dental  (+) Chipped,    Pulmonary former smoker,    Pulmonary exam normal breath sounds clear to auscultation       Cardiovascular negative cardio ROS Normal cardiovascular exam Rhythm:Regular Rate:Normal     Neuro/Psych PSYCHIATRIC DISORDERS Anxiety Depression negative neurological ROS     GI/Hepatic Neg liver ROS, GERD  Medicated and Controlled,  Endo/Other  Morbid obesity  Renal/GU negative Renal ROS     Musculoskeletal negative musculoskeletal ROS (+)   Abdominal   Peds  Hematology  (+) anemia ,   Anesthesia Other Findings   Reproductive/Obstetrics (+) Pregnancy                             Anesthesia Physical Anesthesia Plan  ASA: II  Anesthesia Plan: Spinal   Post-op Pain Management:    Induction: Intravenous  PONV Risk Score and Plan: 2 and Ondansetron and Treatment may vary due to age or medical condition  Airway Management Planned: Natural Airway  Additional Equipment:   Intra-op Plan:   Post-operative Plan:   Informed Consent: I have reviewed the patients History and Physical, chart, labs and discussed the procedure including the risks, benefits and alternatives for the proposed anesthesia with the patient or authorized representative who has indicated his/her understanding and acceptance.   Dental advisory given  Plan Discussed with: CRNA  Anesthesia Plan Comments:         Anesthesia Quick Evaluation

## 2017-08-24 NOTE — Brief Op Note (Signed)
08/24/2017  8:19 AM  PATIENT:  Ermelinda Das  37 y.o. female  PRE-OPERATIVE DIAGNOSIS:  REPEAT EDD: 08/31/17 PCN ALLERGY  POST-OPERATIVE DIAGNOSIS:  repeat cesarean section  PROCEDURE:  Procedure(s) with comments: CESAREAN SECTION (N/A) - Tracey RNFA  SURGEON:  Surgeon(s) and Role:    * Bobbye Charleston, MD - Primary  ASSISTANTS: RNFA   ANESTHESIA:   spinal  EBL:  493 mL   SPECIMEN:  No Specimen  DISPOSITION OF SPECIMEN:  N/A  COUNTS:  YES  TOURNIQUET:  * No tourniquets in log *  DICTATION: .Note written in EPIC  PLAN OF CARE: Admit to inpatient   PATIENT DISPOSITION:  PACU - hemodynamically stable.   Delay start of Pharmacological VTE agent (>24hrs) due to surgical blood loss or risk of bleeding: not applicable

## 2017-08-24 NOTE — Transfer of Care (Signed)
Immediate Anesthesia Transfer of Care Note  Patient: Teresa [name]  Procedure(s) Performed: CESAREAN SECTION (N/A Abdomen)  Patient Location: PACU  Anesthesia Type:Spinal  Level of Consciousness: awake  Airway & Oxygen Therapy: Patient Spontanous Breathing  Post-op Assessment: Report given to RN and Post -op Vital signs reviewed and stable  Post vital signs: Reviewed and stable  Last Vitals:  Vitals:   08/24/17 0550 08/24/17 0554  BP:  120/82  Pulse:  98  Resp:  20  Temp: 36.6 C     Last Pain:  Vitals:   08/24/17 0550  TempSrc: Oral         Complications: No apparent anesthesia complications

## 2017-08-24 NOTE — Anesthesia Postprocedure Evaluation (Signed)
Anesthesia Post Note  Patient: Addyson Traub  Procedure(s) Performed: CESAREAN SECTION (N/A Abdomen)     Patient location during evaluation: PACU Anesthesia Type: Spinal Level of consciousness: oriented and awake and alert Pain management: pain level controlled Vital Signs Assessment: post-procedure vital signs reviewed and stable Respiratory status: spontaneous breathing, respiratory function stable and patient connected to nasal cannula oxygen Cardiovascular status: blood pressure returned to baseline and stable Postop Assessment: no headache, no backache and no apparent nausea or vomiting Anesthetic complications: no    Last Vitals:  Vitals:   08/24/17 1207 08/24/17 1255  BP: (!) 109/58 102/68  Pulse: 70 65  Resp: 18   Temp: 36.6 C 36.4 C  SpO2: 97% 96%    Last Pain:  Vitals:   08/24/17 1255  TempSrc: Axillary  PainSc: 6    Pain Goal:                 Lahela Woodin P [place]

## 2017-08-24 NOTE — Lactation Note (Addendum)
This note was copied from a baby's chart. Lactation Consultation Note  Patient Name: Teresa [name] [id number] Date: 08/24/2017 Reason for consult: Initial assessment;1st time breastfeeding;Term  17 hours old FT female who is being exclusively BF by his mother, she's a P2 but never BF her oldest child. Baby was on mom's breast when entering the room but he had a one piece sleeper and was swaddled in a blanket.  Assisted with latch, asked mom if it's OK to undress baby down to his diaper to keep him awake and interested during the feeding. She agreed. Baby latched on left breast in football position and he did very well, rhythmical sucking and a few swallows were heard. After burping baby, LC tried to latch baby on the right breast but noticed that tissue was semi-compressible and it was challenging to do the "teacup hold" on the right breast.  LC started a NS # 16 for right breast only but baby will not latch with it, tried holding the breast during the entire feeding sandwiching the breast and baby was able to sustain the latch like that. LC also started a manual pump while in the hospital (mom has a Spectra DEBP at home) to help ever the nipple and make the tissue more compressible. Reviewed pump set, cleaning and storage.  Encouraged mom to feed baby STS at least 8-12 times/24 hours and to hand express or pump right breast if bay is still not taking it. Reviewed BF brochure, BF resources and feeding diary. Parents are aware of [facility] services are will call PRN.  Maternal Data Formula Feeding for Exclusion: No Has patient been taught Hand Expression?: Yes Does the patient have breastfeeding experience prior to this delivery?: No  Feeding Feeding Type: Breast Milk Length of feed: 15 min(baby was still nursing when exiting the room)  LATCH Score Latch: Grasps breast easily, tongue down, lips flanged, rhythmical sucking.(on left breast)  Audible Swallowing: A few with stimulation  Type of  Nipple: Everted at rest and after stimulation  Comfort (Breast/Nipple): Soft / non-tender  Hold (Positioning): Assistance needed to correctly position infant at breast and maintain latch.  LATCH Score: 8  Interventions Interventions: Breast feeding basics reviewed;Assisted with latch;Skin to skin;Breast massage;Hand express;Breast compression;Adjust position;Support pillows;Position options  Lactation Tools Discussed/Used Tools: Pump;Nipple Shields Nipple shield size: 16 Breast pump type: Manual WIC Program: No Pump Review: Setup, frequency, and cleaning Initiated by:: MPeck Date initiated:: 08/24/17   Consult Status Consult Status: Follow-up Date: 08/25/17 Follow-up type: In-patient    Iam Lipson Francene Boyers 08/24/2017, 6:25 PM

## 2017-08-24 NOTE — Anesthesia Procedure Notes (Signed)
Spinal  Patient location during procedure: OR Start time: 08/24/2017 7:25 AM End time: 08/24/2017 7:30 AM Staffing Anesthesiologist: Murvin Natal, MD Performed: anesthesiologist  Preanesthetic Checklist Completed: patient identified, surgical consent, pre-op evaluation, timeout performed, IV checked, risks and benefits discussed and monitors and equipment checked Spinal Block Patient position: sitting Prep: DuraPrep Patient monitoring: cardiac monitor, continuous pulse ox and blood pressure Approach: midline Location: L4-5 Injection technique: single-shot Needle Needle type: Pencan  Needle gauge: 24 G Needle length: 9 cm Assessment Sensory level: T10 Additional Notes Functioning IV was confirmed and monitors were applied. Sterile prep and drape, including hand hygiene and sterile gloves were used. The patient was positioned and the spine was prepped. The skin was anesthetized with lidocaine.  Free flow of clear CSF was obtained prior to injecting local anesthetic into the CSF.  The spinal needle aspirated freely following injection.  The needle was carefully withdrawn.  The patient tolerated the procedure well.

## 2017-08-24 NOTE — Consult Note (Signed)
[facility]  Delivery Note:  C-section       08/24/2017  7:43 AM  I was called to the operating room at the request of the patient's obstetrician (Dr. Philis Pique) for a repeat c-section.  PRENATAL HX:  This is a 37 y/o G2P1001 at 69 and 0/[redacted] weeks gestation who presents for a scheduled repeat c-section.  Her pregnancy has been uncomplicated.  AROM at delivery.  Of note, father of baby has neurofibromatosis. Parents were referred to [facility] for genetic counseling, but because of wide variation in genetics, parents declined further testing.  DELIVERY:  Infant was vigorous at delivery, requiring no resuscitation other than standard warming, drying and stimulation.  APGARs 8 and 9.  Exam within normal limits.  After 5 minutes, baby left with nurse to assist parents with skin-to-skin care.   _____________________ Electronically Signed By: Clinton Gallant, MD Neonatologist

## 2017-08-24 NOTE — Progress Notes (Signed)
There has been no change in the patients history, status or exam since the history and physical.  Vitals:   08/24/17 0550 08/24/17 0554  BP:  120/82  Pulse:  98  Resp:  20  Temp: 97.8 F (36.6 C)   TempSrc: Oral   Weight: 258 lb (117 kg)   Height: 5\' 6"  (1.676 m)     Results for orders placed or performed during the hospital encounter of 08/22/17 (from the past 72 hour(s))  CBC     Status: Abnormal   Collection Time: 08/22/17  3:43 PM  Result Value Ref Range   WBC 9.8 4.0 - 10.5 K/uL   RBC 3.71 (L) 3.87 - 5.11 MIL/uL   Hemoglobin 11.3 (L) 12.0 - 15.0 g/dL   HCT 33.1 (L) 36.0 - 46.0 %   MCV 89.2 78.0 - 100.0 fL   MCH 30.5 26.0 - 34.0 pg   MCHC 34.1 30.0 - 36.0 g/dL   RDW 13.0 11.5 - 15.5 %   Platelets 244 150 - 400 K/uL    Comment: Performed at [facility], [address], [place], [place] [address]  RPR     Status: None   Collection Time: 08/22/17  3:43 PM  Result Value Ref Range   RPR Ser Ql Non Reactive Non Reactive    Comment: (NOTE) Performed At: [facility] [place], [place] [id number] Rush Farmer MD [id number] Performed at [facility], [address], [place], [place] [address]   Type and screen     Status: None   Collection Time: 08/22/17  3:43 PM  Result Value Ref Range   ABO/RH(D) A POS    Antibody Screen NEG    Sample Expiration      08/25/2017 Performed at [facility], [address], [place], [place] [address]   ABO/Rh     Status: None   Collection Time: 08/22/17  3:43 PM  Result Value Ref Range   ABO/RH(D)      A POS Performed at [facility], [address], [place], [place] [address]     Angell Pincock A

## 2017-08-24 NOTE — Op Note (Signed)
8:19 AM  PATIENT:  Teresa [name]  37 y.o. female  PRE-OPERATIVE DIAGNOSIS:  REPEAT EDD: 08/31/17 PCN ALLERGY  POST-OPERATIVE DIAGNOSIS:  repeat cesarean section  PROCEDURE:  Procedure(s) with comments: CESAREAN SECTION (N/A) - Tracey RNFA  SURGEON:  Surgeon(s) and Role:    * Bobbye Charleston, MD - Primary  ASSISTANTS: RNFA   ANESTHESIA:   spinal  EBL:  493 mL   SPECIMEN:  No Specimen  DISPOSITION OF SPECIMEN:  N/A  COUNTS:  YES  TOURNIQUET:  * No tourniquets in log *  DICTATION: .Note written in EPIC  PLAN OF CARE: Admit to inpatient   PATIENT DISPOSITION:  PACU - hemodynamically stable.   Delay start of Pharmacological VTE agent (>24hrs) due to surgical blood loss or risk of bleeding: not applicable  Complications:  None Medications:  Ancef, Pitocin Findings:  Baby female, Apgars 8,9, weight P.   Normal tubes, ovaries and uterus seen.  Baby was skin to skin with mother after birth in the OR.  Technique:  After adequate spinal anesthesia was achieved, the patient was prepped and draped in usual sterile fashion.  A foley catheter was used to drain the bladder.  A pfannanstiel incision was made with the scalpel and carried down to the fascia with the bovie cautery. The fascia was incised in the midline with the scalpel and carried in a transverse curvilinear manner bilaterally.  The fascia was reflected superiorly and inferiorly off the rectus muscles and the muscles split in the midline.  A bowel free portion of the peritoneum was entered bluntly and then extended in a superior and inferior manner with good visualization of the bowel and bladder.  The Alexis instrument was then placed and the vesico-uterine fascia tented up and incised in a transverse curvilinear manner.  A 2 cm transverse incision was made in the upper portion of the lower uterine segment until the amnion was exposed.   The incision was extended transversely in a blunt manner.  Clear fluid was noted and  the baby delivered in the vertex presentation without complication.  The baby was bulb suctioned and the cord was clamped and cut aftet stripping blood from cord into baby.  The baby was then handed to awaiting Neonatology.  The placenta was then delivered manually and the uterus cleared of all debris.  The uterine incision was then closed with a running lock stitch of 0 monocryl.  An imbricating layer of 0 monocryl was closed as well. Excellent hemostasis of the uterine incision was achieved and the abdomen was cleared with irrigation.  The peritoneum was closed with a running stitch of 2-0 vicryl.  This incorporated the rectus muscles as a separate layer.  The fascia was then closed with a running stitch of 0 vicryl.  The subcutaneous layer was closed with interrupted  stitches of 2-0 plain gut.  The skin was closed with 4-0 vicryl on a Keith needle and steri-strips.  The patient tolerated the procedure well and was returned to the recovery room in stable condition.  All counts were correct times three.  Tyrika Newman A

## 2017-08-25 ENCOUNTER — Encounter (HOSPITAL_COMMUNITY): Payer: Self-pay | Admitting: *Deleted

## 2017-08-25 LAB — CBC
HCT: 28 % — ABNORMAL LOW (ref 36.0–46.0)
HEMOGLOBIN: 9.3 g/dL — AB (ref 12.0–15.0)
MCH: 30.5 pg (ref 26.0–34.0)
MCHC: 33.2 g/dL (ref 30.0–36.0)
MCV: 91.8 fL (ref 78.0–100.0)
PLATELETS: 203 10*3/uL (ref 150–400)
RBC: 3.05 MIL/uL — AB (ref 3.87–5.11)
RDW: 13.4 % (ref 11.5–15.5)
WBC: 10.5 10*3/uL (ref 4.0–10.5)

## 2017-08-25 NOTE — Progress Notes (Signed)
MOB was referred for history of depression/anxiety. * Referral screened out by Clinical Social Worker because none of the following criteria appear to apply: ~ History of anxiety/depression during this pregnancy, or of post-partum depression. ~ Diagnosis of anxiety and/or depression within last 3 years OR * MOB's symptoms currently being treated with medication and/or therapy. Please contact the Clinical Social Worker if needs arise, by MOB request, or if MOB scores greater than 9/yes to question 10 on [place] Postpartum Depression Screen.  Teresa [name], MSW, LCSW Clinical Social Work [phone] 

## 2017-08-25 NOTE — Lactation Note (Signed)
This note was copied from a baby's chart. Lactation Consultation Note Baby 68 hrs old, cueing, fussy but will not latch. Mom states baby has a lot of gas and lots of stools causing baby to be fussy. Assisted in football position, mom has LARGE pendulum breast w/flat nipple at the bottom end of breast. Able to express colostrum, give to baby w/spoon after multiple attempts to BF only suckling maybe 1 min. Mostly held nipple in mouth while LC hand expressed opposite breast. Elevated moms breast w/cloth diaper rolled up. Baby will not rest in crib, wants to be held. Placed baby STS w/mom. Shells given to mom. Encouraged to wear this am. W/much stimulation nipple is compressible but slightly thick. If baby would suckle on nipple, would thin out more to obtain deeper latch. Assessed duck, baby is tongue thrusting a lot.  Discussed newborn feeding habits and behavior. Encouraged to attempt to latch, hand express give colostrum, and pump for stimulation when baby doesn't feed.   Patient Name: Teresa [name] [id number] Date: 08/25/2017 Reason for consult: Follow-up assessment;Mother's request;Difficult latch   Maternal Data    Feeding Feeding Type: Breast Milk Length of feed: 0 min  LATCH Score Latch: Too sleepy or reluctant, no latch achieved, no sucking elicited.  Audible Swallowing: None  Type of Nipple: Flat  Comfort (Breast/Nipple): Soft / non-tender  Hold (Positioning): Full assist, staff holds infant at breast  LATCH Score: 3  Interventions Interventions: Breast feeding basics reviewed;Adjust position;Assisted with latch;Support pillows;Position options;Skin to skin;Breast massage;Expressed milk;Hand express;Pre-pump if needed;Shells;Breast compression;Reverse pressure  Lactation Tools Discussed/Used Tools: Shells;Pump Shell Type: Inverted Breast pump type: Manual;Double-Electric Breast Pump(moms pump)   Consult Status Consult Status: Follow-up Date: 08/25/17 Follow-up  type: In-patient    Ihsan Nomura, Elta Gu[place] 08/25/2017, 2:14 AM

## 2017-08-25 NOTE — Progress Notes (Signed)
Patient is eating, ambulating, voiding.  Pain control is good.  Appropriate lochia, no complaints.  Vitals:   08/24/17 2231 08/25/17 0200 08/25/17 0600 08/25/17 0751  BP: (!) 95/50 (!) 96/41 (!) 92/48   Pulse: 61 64 72   Resp: 16 16 16    Temp: 98.7 F (37.1 C) 99.3 F (37.4 C) 100.2 F (37.9 C) 98.5 F (36.9 C)  TempSrc: Oral Oral Oral Oral  SpO2: 97% 97% 97%   Weight:      Height:        Fundus firm Inc: c/c/i Ext: no calf tenderness  Lab Results  Component Value Date   WBC 10.5 08/25/2017   HGB 9.3 (L) 08/25/2017   HCT 28.0 (L) 08/25/2017   MCV 91.8 08/25/2017   PLT 203 08/25/2017    --/--/A POS, A POS Performed at [facility], [address], [place], [place] [address]  (03/08 1543)  A/P Post op day #1 s/p repeat c/s  Routine care.  Expect d/c 3/12. Circ desired, reviewed risks, complications, consent obtained.    Allyn Kenner

## 2017-08-26 MED ORDER — DOCUSATE SODIUM 100 MG PO CAPS: 100.0000 mg | ORAL_CAPSULE | Freq: Two times a day (BID) | ORAL | 0 refills | Status: DC

## 2017-08-26 MED ORDER — OXYCODONE-ACETAMINOPHEN 5-325 MG PO TABS: 1.0000 | ORAL_TABLET | Freq: Four times a day (QID) | ORAL | 0 refills | Status: DC | PRN

## 2017-08-26 MED ORDER — IBUPROFEN 600 MG PO TABS: 600.0000 mg | ORAL_TABLET | Freq: Four times a day (QID) | ORAL | 0 refills | Status: DC | PRN

## 2017-08-26 NOTE — Progress Notes (Signed)
Subjective: Postpartum Day 2: Cesarean Delivery Patient reports nausea, vomiting, incisional pain and tolerating PO.    Objective: Vital signs in last 24 hours: Temp:  [97.9 F (36.6 C)-98 F (36.7 C)] 98 F (36.7 C) (03/12 0550) Pulse Rate:  [71-89] 77 (03/12 0550) Resp:  [16-17] 16 (03/12 0550) BP: (84-110)/(52-60) 110/60 (03/12 0550) SpO2:  [98 %] 98 % (03/11 1900)  Physical Exam:  General: alert, cooperative and appears stated age Lochia: appropriate Uterine Fundus: firm Incision: healing well DVT Evaluation: No evidence of DVT seen on physical exam.  Recent Labs    08/25/17 0547  HGB 9.3*  HCT 28.0*    Assessment/Plan: Status post Cesarean section. Doing well postoperatively.  Continue current care. Desires discharge home today  Vanessa Kick 08/26/2017, 8:54 AM

## 2017-08-26 NOTE — Plan of Care (Signed)
Lactation Consultant has developed a feeding plan with parents for obtaining additional calories with expressed breast milk and/or formula via supplemental feeding system at the breast until milk comes to volume and infant having steady weight gain.

## 2017-08-26 NOTE — Lactation Note (Signed)
This note was copied from a baby's chart. Lactation Consultation Note  Patient Name: Teresa [name] [id number] Date: 08/26/2017 Reason for consult: Follow-up assessment;Infant weight loss(11% weight loss, see LC note for details ) Baby 50 hours old LC reviewed and updated the doc flow sheets.  Per Dr. Aurther Loft would like the baby supplemented with every feeding, d/c today and re-weight tomorrow in the office.  It has been 3 1/2 hours since the baby fed. LC assisted with latching after RN did the assessment.  LC reviewed and showed dad how help mom obtain depth at the breast.  Depth achieved with swallows, and then showed dad how to insert the 51F SNS, took 2 attempts  And good seal obtained , baby took 24 ml and fed for 15 mins, satisfied after feeding and mom comfortable.  Per mom nipples sensitive, LC assessed and noted the nipples to be pink , no break down, ( explained to mom transient soreness due to hormones ), some areola edema noted. Sore nipple and engorgement prevention and tx reviewed.  Discussed nutritive vs non - nutritive feeding patterns and to watch for hanging out latched.  LC reviewed basics.  LC offered mom and dad and LC O/P appt. And mom preferred to call if the weight wasn't increasing.  Mother informed of post-discharge support and given phone number to the lactation department, including services for phone call assistance; out-patient appointments; and breastfeeding support group. List of other breastfeeding resources in the community given in the handout. Encouraged mother to call for problems or concerns related to breastfeeding.  Maternal Data Has patient been taught Hand Expression?: Yes  Feeding Feeding Type: Breast Milk with Formula added Length of feed: 15 min  LATCH Score Latch: Grasps breast easily, tongue down, lips flanged, rhythmical sucking.  Audible Swallowing: Spontaneous and intermittent  Type of Nipple: Everted at rest and after  stimulation  Comfort (Breast/Nipple): Filling, red/small blisters or bruises, mild/mod discomfort  Hold (Positioning): Assistance needed to correctly position infant at breast and maintain latch.  LATCH Score: 8  Interventions Interventions: Breast feeding basics reviewed;Assisted with latch;Skin to skin;Breast massage;Reverse pressure;Breast compression;Adjust position;Support pillows;Position options;Expressed milk;Shells;Hand pump  Lactation Tools Discussed/Used Tools: Shells;Pump;Other (comment);51F feeding tube / Syringe( mom using her own DEBP Spectra 2 ) Nipple shield size: Other (comment)(per mom baby did not like the NS and has been latching without it ) Shell Type: Inverted Pump Review: Milk Storage   Consult Status Consult Status: Follow-up Date: ([place] offered mom and dad and LC O/P appt. and mopm preferred to call back if weight isn't increasing ) Follow-up type: [place] 08/26/2017, 10:12 AM

## 2017-08-31 ENCOUNTER — Other Ambulatory Visit: Payer: Self-pay

## 2017-08-31 ENCOUNTER — Encounter (HOSPITAL_COMMUNITY): Payer: Self-pay | Admitting: *Deleted

## 2017-08-31 ENCOUNTER — Inpatient Hospital Stay (HOSPITAL_COMMUNITY)
Admission: AD | Admit: 2017-08-31 | Discharge: 2017-08-31 | Disposition: A | Discharge status: Home / Self Care | Payer: BC Managed Care – PPO | Source: Ambulatory Visit | Attending: Obstetrics and Gynecology | Admitting: Obstetrics and Gynecology

## 2017-08-31 DIAGNOSIS — R079 Chest pain, unspecified: Secondary | ICD-10-CM | POA: Insufficient documentation

## 2017-08-31 DIAGNOSIS — O9089 Other complications of the puerperium, not elsewhere classified: Secondary | ICD-10-CM | POA: Insufficient documentation

## 2017-08-31 DIAGNOSIS — Z9889 Other specified postprocedural states: Secondary | ICD-10-CM | POA: Diagnosis not present

## 2017-08-31 DIAGNOSIS — R0789 Other chest pain: Secondary | ICD-10-CM | POA: Diagnosis not present

## 2017-08-31 LAB — CBC WITH DIFFERENTIAL/PLATELET
BASOS ABS: 0 10*3/uL (ref 0.0–0.1)
Basophils Relative: 0 %
EOS ABS: 0.1 10*3/uL (ref 0.0–0.7)
EOS PCT: 2 %
HCT: 31.9 % — ABNORMAL LOW (ref 36.0–46.0)
Hemoglobin: 10.7 g/dL — ABNORMAL LOW (ref 12.0–15.0)
Lymphocytes Relative: 29 %
Lymphs Abs: 2 10*3/uL (ref 0.7–4.0)
MCH: 30.8 pg (ref 26.0–34.0)
MCHC: 33.5 g/dL (ref 30.0–36.0)
MCV: 91.9 fL (ref 78.0–100.0)
Monocytes Absolute: 0.2 10*3/uL (ref 0.1–1.0)
Monocytes Relative: 3 %
Neutro Abs: 4.4 10*3/uL (ref 1.7–7.7)
Neutrophils Relative %: 66 %
PLATELETS: 326 10*3/uL (ref 150–400)
RBC: 3.47 MIL/uL — AB (ref 3.87–5.11)
RDW: 13.1 % (ref 11.5–15.5)
WBC: 6.7 10*3/uL (ref 4.0–10.5)

## 2017-08-31 LAB — COMPREHENSIVE METABOLIC PANEL
ALT: 18 U/L (ref 14–54)
AST: 22 U/L (ref 15–41)
Albumin: 2.9 g/dL — ABNORMAL LOW (ref 3.5–5.0)
Alkaline Phosphatase: 63 U/L (ref 38–126)
Anion gap: 10 (ref 5–15)
BUN: 14 mg/dL (ref 6–20)
CHLORIDE: 103 mmol/L (ref 101–111)
CO2: 22 mmol/L (ref 22–32)
CREATININE: 0.86 mg/dL (ref 0.44–1.00)
Calcium: 8.7 mg/dL — ABNORMAL LOW (ref 8.9–10.3)
GFR calc Af Amer: >60 mL/min (ref >60)
GFR calc non Af Amer: >60 mL/min (ref >60)
Glucose, Bld: 87 mg/dL (ref 65–99)
POTASSIUM: 3.9 mmol/L (ref 3.5–5.1)
SODIUM: 135 mmol/L (ref 135–145)
Total Bilirubin: 0.5 mg/dL (ref 0.3–1.2)
Total Protein: 6.6 g/dL (ref 6.5–8.1)

## 2017-08-31 LAB — TROPONIN I

## 2017-08-31 LAB — D-DIMER, QUANTITATIVE: D-Dimer, Quant: 3.59 ug/mL-FEU — ABNORMAL HIGH (ref 0.00–0.50)

## 2017-08-31 NOTE — MAU Note (Signed)
Awoke about 0315 to feed baby. When sat up in bed hand pain in mid chest and difficulty breathing. Some dizziness and nausea. REpeat C/S 3/10

## 2017-08-31 NOTE — MAU Provider Note (Signed)
Chief Complaint: Chest Pain; Dizziness; Shortness of Breath; and Nausea   First Provider Initiated Contact with Patient 08/31/17 0549     SUBJECTIVE HPI: Teresa [name] is a 37 y.o. G2P2002 at 7 days post-op C/S who presents to [facility] reporting chest pressure and pain that started during the night when she sat up to feed her baby. Started as mid chest pressure and progressed to 6/10 pain. All Sx resolved almost completely by the time she arrived in MAU. Pt states she called Dr. Rogue Bussing and was instructed to come to MAU for eval.  No cardiac Hx. Remote Hx of needing inhaler w/ URI, but no true Asthma Dx. No inhaler in ~7 years. Reports feeling ery tearful and struggling w/ emotions since delivery. Wondered is she could have been having a panic attack, but did not actually feel emotionally distressed until after Sx started--then got anxious.   Location: mid chest Quality: pressure, pain Severity: 6/10 on pain scale Duration: ~1 hour Context: 1 weeks post-op C/S Timing: constant Modifying factors: None. Hasn't tried anything for the pain.  Associated signs and symptoms: Pos for SOB, nausea, constipation (improving w/ Colace), edema from waist down, bilat and hands. Neg for fever, chills, vomiting, hemoptysis, wheezing, congestion, calf pain.   Past Medical History:  Diagnosis Date  . Allergy   . Anemia   . Anxiety   . Complication of anesthesia   . Depression    wellbutrin in past  . GERD (gastroesophageal reflux disease)   . PONV (postoperative nausea and vomiting)    OB History  Gravida Para Term Preterm AB Living  2 2 2     2   SAB TAB Ectopic Multiple Live Births        0 2    # Outcome Date GA Lbr Len/2nd Weight Sex Delivery Anes PTL Lv  2 Term 08/24/17 [redacted]w[redacted]d  7 lb 1.6 oz (3.221 kg) M CS-LTranv Spinal  LIV  1 Term 07/22/03 [redacted]w[redacted]d  5 lb 2 oz (2.325 kg) F CS-LTranv EPI N LIV     Past Surgical History:  Procedure Laterality Date  . CESAREAN SECTION    .  CESAREAN SECTION N/A 08/24/2017   Procedure: CESAREAN SECTION;  Surgeon: Bobbye Charleston, MD;  Location: [facility];  Service: Obstetrics;  Laterality: N/A;  Tracey RNFA  . NASAL SEPTOPLASTY W/ TURBINOPLASTY    . WISDOM TOOTH EXTRACTION     Social History   Socioeconomic History  . Marital status: Married    Spouse name: Not on file  . Number of children: Not on file  . Years of education: Not on file  . Highest education level: Not on file  Social Needs  . Financial resource strain: Not on file  . Food insecurity - worry: Not on file  . Food insecurity - inability: Not on file  . Transportation needs - medical: Not on file  . Transportation needs - non-medical: Not on file  Occupational History  . Not on file  Tobacco Use  . Smoking status: Former Smoker    Types: Cigarettes    Last attempt to quit: 01/06/2006    Years since quitting: 11.6  . Smokeless tobacco: Never Used  Substance and Sexual Activity  . Alcohol use: No    Alcohol/week: 1.2 - 1.8 oz    Types: 2 - 3 Glasses of wine per week    Frequency: Never    Comment: none with preg  . Drug use: No  . Sexual activity: Yes  Other Topics Concern  . Not on file  Social History Narrative  . Not on file   Family History  Problem Relation Age of Onset  . Hyperlipidemia Father   . Hypertension Father   . Fibromyalgia Father   . Alcohol abuse Father   . Hyperlipidemia Maternal Grandmother   . Hypertension Maternal Grandmother   . Stroke Maternal Grandmother   . Osteoporosis Maternal Grandmother   . Diabetes Maternal Grandfather   . Hyperlipidemia Maternal Grandfather   . Hypertension Maternal Grandfather   . Hypertension Paternal Grandmother   . Arthritis Paternal Grandmother   . Cancer Paternal Grandmother   . Cancer - Other Paternal Grandmother   . Fibromyalgia Mother   . Melanoma Mother   . Lupus Sister   . Sudden death Neg Hx    No current facility-administered medications on file prior to  encounter.    Current Outpatient Medications on File Prior to Encounter  Medication Sig Dispense Refill  . docusate sodium (COLACE) 100 MG capsule Take 1 capsule (100 mg total) by mouth 2 (two) times daily. 60 capsule 0  . ibuprofen (ADVIL,MOTRIN) 600 MG tablet Take 1 tablet (600 mg total) by mouth every 6 (six) hours as needed. 90 tablet 0  . oxyCODONE-acetaminophen (PERCOCET/ROXICET) 5-325 MG tablet Take 1-2 tablets by mouth every 6 (six) hours as needed for severe pain. 30 tablet 0  . Prenatal Vit-Fe Fumarate-FA (PRENATAL VITAMIN PO) Take 1 tablet by mouth daily.     . calcium carbonate (TUMS - DOSED IN MG ELEMENTAL CALCIUM) 500 MG chewable tablet Chew 1 tablet by mouth 3 (three) times daily as needed for indigestion or heartburn.    . fluticasone (FLONASE) 50 MCG/ACT nasal spray Place 2 sprays into both nostrils daily. (Patient taking differently: Place 1 spray into both nostrils daily. ) 16 g 6   Allergies  Allergen Reactions  . Penicillins Hives    Has patient had a PCN reaction causing immediate rash, facial/tongue/throat swelling, SOB or lightheadedness with hypotension: unknown Has patient had a PCN reaction causing severe rash involving mucus membranes or skin necrosis: unknown Has patient had a PCN reaction that required hospitalization: no  Has patient had a PCN reaction occurring within the last 10 years: no If all of the above answers are "NO", then may proceed with Cephalosporin use.   . Sertraline Hcl     Arlina Robes     I have reviewed patient's Past Medical Hx, Surgical Hx, Family Hx, Social Hx, medications and allergies.   Review of Systems  Constitutional: Negative for chills and fever.  HENT: Negative for congestion, rhinorrhea and sinus pressure.   Respiratory: Positive for shortness of breath. Negative for cough, chest tightness and wheezing.   Cardiovascular: Positive for chest pain and leg swelling (bilat). Negative for palpitations.  Gastrointestinal: Positive for  constipation and nausea. Negative for abdominal pain, diarrhea and vomiting.    OBJECTIVE Patient Vitals for the past 24 hrs:  BP Temp Temp src Pulse Resp SpO2 Height Weight  08/31/17 0614 (!) 110/58 - - 73 - - - -  08/31/17 0400 124/75 97.7 F (36.5 C) Oral 73 20 99 % - -  08/31/17 0358 - - - - - - 5\' 6"  (1.676 m) 251 lb (113.9 kg)   Constitutional: Well-developed, well-nourished female in no acute distress. Tearful.  Skin: No cyanosis, rash or diaphoresis.  Cardiovascular: normal rate and rhythm. No M/R/G Respiratory: normal rate and effort. CTAB.  GI: Abd soft, non-tender, LTCS incision healing well.  Steristrips in place. MS: Extremities nontender, 1+-2+ bilat LE edema from knees down.. Neg Homan's. No cords , warmth or erythema.  Neurologic: Alert and oriented x 4.  GU: Deferred  LAB RESULTS Results for orders placed or performed during the hospital encounter of 08/31/17 (from the past 24 hour(s))  Troponin I     Status: None   Collection Time: 08/31/17  4:11 AM  Result Value Ref Range   Troponin I <0.03 <0.03 ng/mL  CBC with Differential/Platelet     Status: Abnormal   Collection Time: 08/31/17  4:11 AM  Result Value Ref Range   WBC 6.7 4.0 - 10.5 K/uL   RBC 3.47 (L) 3.87 - 5.11 MIL/uL   Hemoglobin 10.7 (L) 12.0 - 15.0 g/dL   HCT 31.9 (L) 36.0 - 46.0 %   MCV 91.9 78.0 - 100.0 fL   MCH 30.8 26.0 - 34.0 pg   MCHC 33.5 30.0 - 36.0 g/dL   RDW 13.1 11.5 - 15.5 %   Platelets 326 150 - 400 K/uL   Neutrophils Relative % 66 %   Neutro Abs 4.4 1.7 - 7.7 K/uL   Lymphocytes Relative 29 %   Lymphs Abs 2.0 0.7 - 4.0 K/uL   Monocytes Relative 3 %   Monocytes Absolute 0.2 0.1 - 1.0 K/uL   Eosinophils Relative 2 %   Eosinophils Absolute 0.1 0.0 - 0.7 K/uL   Basophils Relative 0 %   Basophils Absolute 0.0 0.0 - 0.1 K/uL  Comprehensive metabolic panel     Status: Abnormal   Collection Time: 08/31/17  4:11 AM  Result Value Ref Range   Sodium 135 135 - 145 mmol/L   Potassium 3.9  3.5 - 5.1 mmol/L   Chloride 103 101 - 111 mmol/L   CO2 22 22 - 32 mmol/L   Glucose, Bld 87 65 - 99 mg/dL   BUN 14 6 - 20 mg/dL   Creatinine, Ser 0.86 0.44 - 1.00 mg/dL   Calcium 8.7 (L) 8.9 - 10.3 mg/dL   Total Protein 6.6 6.5 - 8.1 g/dL   Albumin 2.9 (L) 3.5 - 5.0 g/dL   AST 22 15 - 41 U/L   ALT 18 14 - 54 U/L   Alkaline Phosphatase 63 38 - 126 U/L   Total Bilirubin 0.5 0.3 - 1.2 mg/dL   GFR calc non Af Amer >60 >60 mL/min   GFR calc Af Amer >60 >60 mL/min   Anion gap 10 5 - 15  D-dimer, quantitative (not at [facility])     Status: Abnormal   Collection Time: 08/31/17  4:11 AM  Result Value Ref Range   D-Dimer, Quant 3.59 (H) 0.00 - 0.50 ug/mL-FEU    IMAGING No results found.  MAU COURSE Orders Placed This Encounter  Procedures  . Troponin I  . CBC with Differential/Platelet  . Comprehensive metabolic panel  . D-dimer, quantitative (not at [facility])  . EKG 12-Lead  . Discharge patient   Discussed Hx, labs, exam w/ Dr. Rogue Bussing. Agrees w/ POC. New orders: None. Imaging not indicated w/ near-resolution of all Sx spontaneously.   MDM - 37 year-old female 7 days post-op C/S w/ acute, spontaneously resolving chest pain and pressure. Low suspicion for PE even w/ elevated D-dimer w/ due to spontaneous resolutions of Sx. D-Dimer likely elevated from post-partum and post-op state.   ASSESSMENT 1. Chest pressure   2. Post-operative state   3. Postpartum state     PLAN Discharge home in stable condition per consult w/ Dr.  Rogue Bussing. PE precautions Follow-up Information    [facility], [facility] Follow up on 09/05/2017.   Why:  as needed for follow-up visit Contact information: [address] [place] [address] [facility] Follow [facility].   Why:  as needed if symptoms worsen Contact information: [address] [id number] [place] [phone]         Allergies as  of 08/31/2017      Reactions   Penicillins Hives   Has patient had a PCN reaction causing immediate rash, facial/tongue/throat swelling, SOB or lightheadedness with hypotension: unknown Has patient had a PCN reaction causing severe rash involving mucus membranes or skin necrosis: unknown Has patient had a PCN reaction that required hospitalization: no  Has patient had a PCN reaction occurring within the last 10 years: no If all of the above answers are "NO", then may proceed with Cephalosporin use.   Sertraline Hcl    [place]       Medication List    TAKE these medications   calcium carbonate 500 MG chewable tablet Commonly known as:  TUMS - dosed in mg elemental calcium Chew 1 tablet by mouth 3 (three) times daily as needed for indigestion or heartburn.   docusate sodium 100 MG capsule Commonly known as:  COLACE Take 1 capsule (100 mg total) by mouth 2 (two) times daily.   fluticasone 50 MCG/ACT nasal spray Commonly known as:  FLONASE Place 2 sprays into both nostrils daily. What changed:  how much to take   ibuprofen 600 MG tablet Commonly known as:  ADVIL,MOTRIN Take 1 tablet (600 mg total) by mouth every 6 (six) hours as needed.   oxyCODONE-acetaminophen 5-325 MG tablet Commonly known as:  PERCOCET/ROXICET Take 1-2 tablets by mouth every 6 (six) hours as needed for severe pain.   PRENATAL VITAMIN PO Take 1 tablet by mouth daily.        Tamala Julian, [place], [place] 08/31/2017  6:08 AM

## 2017-08-31 NOTE — Discharge Instructions (Signed)
Patient education: Chest pain (The Basics) Written by the doctors and editors at [employer] Should I call for an ambulance if I have chest pain?--You should call for an ambulance (in the [place] and [place], dial 9-1-1) if the pain: ?Is new or severe ?Happens along with shortness of breath ?Lasts more than a few minutes ?Gets worse when you walk, climb stairs, or do other types of physical activity ?Scares or worries you Having chest pain does not necessarily mean you are having a heart attack. Most people who go to the emergency room with chest pain are not having a heart attack. Their pain is usually caused by less serious problems, such as muscle pain, heartburn, or anxiety. Even so, you should not take any chances. People often delay seeking help for a heart attack because they think the symptoms are not serious or will go away. When they do that, they risk permanent damage to their heart - or even death. Is chest pain the only important symptom of a heart attack?--No. Other symptoms are important, too. Sometimes people do not go to the hospital because they do not have any pain at all. But it is possible to have a heart attack without pain. This is more likely in women, people with diabetes, and people older than 61. It is important to pay attention to any of the symptoms of a heart attack (figure 1), which can include: ?Pain, pressure, or discomfort in the center of the chest ?Pain, tingling, or discomfort in other parts of the upper body, including the arms, back, neck, jaw, or stomach ?Shortness of breath ?Nausea, vomiting, burping, or heartburn ?Sweating or having cold, clammy skin ?A racing or uneven heart rate ?Feeling dizzy or lightheaded or even fainting These symptoms are important if they last more than a few minutes or keep happening over and over (coming and going). If you think you might be having a heart attack, call for an ambulance (in the [place] and [place], [place] 9-1-1) right away. Do  not try to get to the hospital on your own. Is heart attack the only cause of chest pain?--No. Chest pain can be caused by lots of other problems, including: ?Heart problems other than heart attacks, such as infection around the heart ?Muscle soreness after an activity that involves the chest muscles ?Diseases that cause pain, such as arthritis ?Shingles (herpes zoster), a condition linked to the chickenpox virus that also causes a painful rash ?Any kind of injury to the chest, including surgery ?Digestive problems such as heartburn, acid reflux, stomach ulcers, or irritable bowel syndrome ?Problems affecting the lungs, such as pneumonia (an infection in the lungs) or blood clots in the lungs ?Psychological problems, such as panic disorder or depression ?Weakening of the lining of the big blood vessel in the chest (called the aorta) What will happen if I go to the emergency room?--The people treating you in the emergency room will examine you and then run tests to try to find the cause of your pain. But don't be surprised if you do not find out right away why you have pain. The cause of chest pain is not always easy to find. Even so, doctors can usually tell if your heart is in trouble. The tests you might have include: ?An electrocardiogram (ECG) - This test measures the electrical activity in your heart (figure 2). It can help doctors find out if you are having a heart attack. ?Blood tests - During a heart attack, the heart releases certain chemicals.  If these chemicals are in your blood, it might mean you are having a heart attack. ?A stress test - During a stress test, you might be asked to run or walk on a treadmill while you also have an ECG (figure 3). Physical activity increases the heart's need for blood. This test helps doctors see if the heart is getting enough blood. If you cannot walk or run, your doctor might do this test by giving you a medicine to make your heart pump  faster. ?Cardiac catheterization (also called "cardiac cath") - During this test, the doctor puts a thin tube into a blood vessel in your leg or arm. Then he or she moves the tube up to your heart. Next, the doctor puts a dye that shows up on X-ray into the tube. This part of the test is called "coronary angiography." It can show whether any of the arteries in your heart are clogged. ?A CT scan - This is a special kind of X-ray. Your doctor might use this to look at the blood vessels going to your heart. What if I am having a heart attack?--If you are having a heart attack, the doctor will give you treatments to reduce the damage to your heart and relieve your pain. These treatments are discussed in more detail elsewhere. (See "Patient education: Heart attack (The Basics)".) The sooner you get treated for a heart attack, the better treatment will work. Every minute counts when it comes to keeping your heart muscle alive!

## 2017-09-08 NOTE — Discharge Summary (Signed)
Obstetric Discharge Summary Reason for Admission: cesarean section Prenatal Procedures: ultrasound Intrapartum Procedures: cesarean: low cervical, transverse Postpartum Procedures: none Complications-Operative and Postpartum: none Hemoglobin  Date Value Ref Range Status  08/31/2017 10.7 (L) 12.0 - 15.0 g/dL Final   HCT  Date Value Ref Range Status  08/31/2017 31.9 (L) 36.0 - 46.0 % Final    Physical Exam:  General: alert, cooperative and appears stated age 37: appropriate Uterine Fundus: firm Incision: healing well DVT Evaluation: No evidence of DVT seen on physical exam.  Discharge Diagnoses: Term Pregnancy-delivered  Discharge Information: Date: 09/08/2017 Activity: pelvic rest Diet: routine Medications: Ibuprofen, Colace and Percocet Condition: improved Instructions: refer to practice specific booklet Discharge to: home   Newborn Data: Live born female  Birth Weight: 7 lb 1.6 oz (3221 g) APGAR: 8, 9  Newborn Delivery   Birth date/time:  08/24/2017 07:48:00 Delivery type:  C-Section, Low Transverse C-section categorization:  Repeat     Home with mother.  Teresa [name] 09/08/2017, 4:48 PM

## 2019-03-09 ENCOUNTER — Emergency Department (HOSPITAL_COMMUNITY)
Admission: EM | Admit: 2019-03-09 | Discharge: 2019-03-09 | Disposition: A | Discharge status: Home / Self Care | Payer: BC Managed Care – PPO | Attending: Emergency Medicine | Admitting: Emergency Medicine

## 2019-03-09 ENCOUNTER — Emergency Department (HOSPITAL_COMMUNITY): Payer: BC Managed Care – PPO

## 2019-03-09 ENCOUNTER — Encounter (HOSPITAL_COMMUNITY): Payer: Self-pay | Admitting: Emergency Medicine

## 2019-03-09 ENCOUNTER — Other Ambulatory Visit: Payer: Self-pay

## 2019-03-09 DIAGNOSIS — R509 Fever, unspecified: Secondary | ICD-10-CM | POA: Diagnosis not present

## 2019-03-09 DIAGNOSIS — Z87891 Personal history of nicotine dependence: Secondary | ICD-10-CM | POA: Insufficient documentation

## 2019-03-09 DIAGNOSIS — K529 Noninfective gastroenteritis and colitis, unspecified: Secondary | ICD-10-CM | POA: Diagnosis not present

## 2019-03-09 DIAGNOSIS — R112 Nausea with vomiting, unspecified: Secondary | ICD-10-CM | POA: Diagnosis not present

## 2019-03-09 DIAGNOSIS — R9389 Abnormal findings on diagnostic imaging of other specified body structures: Secondary | ICD-10-CM | POA: Diagnosis not present

## 2019-03-09 DIAGNOSIS — Z20828 Contact with and (suspected) exposure to other viral communicable diseases: Secondary | ICD-10-CM | POA: Insufficient documentation

## 2019-03-09 DIAGNOSIS — R109 Unspecified abdominal pain: Secondary | ICD-10-CM

## 2019-03-09 DIAGNOSIS — R1010 Upper abdominal pain, unspecified: Secondary | ICD-10-CM | POA: Diagnosis present

## 2019-03-09 DIAGNOSIS — R10816 Epigastric abdominal tenderness: Secondary | ICD-10-CM | POA: Diagnosis not present

## 2019-03-09 DIAGNOSIS — N83209 Unspecified ovarian cyst, unspecified side: Secondary | ICD-10-CM

## 2019-03-09 LAB — CBC
HCT: 41 % (ref 36.0–46.0)
Hemoglobin: 13.6 g/dL (ref 12.0–15.0)
MCH: 31.4 pg (ref 26.0–34.0)
MCHC: 33.2 g/dL (ref 30.0–36.0)
MCV: 94.7 fL (ref 80.0–100.0)
Platelets: 312 10*3/uL (ref 150–400)
RBC: 4.33 MIL/uL (ref 3.87–5.11)
RDW: 12.4 % (ref 11.5–15.5)
WBC: 6.5 10*3/uL (ref 4.0–10.5)
nRBC: 0 % (ref 0.0–0.2)

## 2019-03-09 LAB — COMPREHENSIVE METABOLIC PANEL
ALT: 25 U/L (ref 0–44)
AST: 24 U/L (ref 15–41)
Albumin: 3.7 g/dL (ref 3.5–5.0)
Alkaline Phosphatase: 39 U/L (ref 38–126)
Anion gap: 8 (ref 5–15)
BUN: 10 mg/dL (ref 6–20)
CO2: 22 mmol/L (ref 22–32)
Calcium: 8.8 mg/dL — ABNORMAL LOW (ref 8.9–10.3)
Chloride: 105 mmol/L (ref 98–111)
Creatinine, Ser: 0.82 mg/dL (ref 0.44–1.00)
GFR calc Af Amer: >60 mL/min (ref >60)
GFR calc non Af Amer: >60 mL/min (ref >60)
Glucose, Bld: 94 mg/dL (ref 70–99)
Potassium: 4.1 mmol/L (ref 3.5–5.1)
Sodium: 135 mmol/L (ref 135–145)
Total Bilirubin: 0.9 mg/dL (ref 0.3–1.2)
Total Protein: 7.8 g/dL (ref 6.5–8.1)

## 2019-03-09 LAB — URINALYSIS, ROUTINE W REFLEX MICROSCOPIC
Bilirubin Urine: NEGATIVE
Glucose, UA: NEGATIVE mg/dL
Ketones, ur: NEGATIVE mg/dL
Leukocytes,Ua: NEGATIVE
Nitrite: NEGATIVE
Protein, ur: NEGATIVE mg/dL
Specific Gravity, Urine: 1.005 (ref 1.005–1.030)
pH: 6 (ref 5.0–8.0)

## 2019-03-09 LAB — PROTIME-INR
INR: 1.1 (ref 0.8–1.2)
Prothrombin Time: 13.6 seconds (ref 11.4–15.2)

## 2019-03-09 LAB — I-STAT BETA HCG BLOOD, ED (MC, WL, AP ONLY): I-stat hCG, quantitative: <5 m[IU]/mL (ref <5)

## 2019-03-09 LAB — SARS CORONAVIRUS 2 BY RT PCR (HOSPITAL ORDER, PERFORMED IN ~~LOC~~ HOSPITAL LAB): SARS Coronavirus 2: NEGATIVE

## 2019-03-09 LAB — APTT: aPTT: 30 seconds (ref 24–36)

## 2019-03-09 LAB — LIPASE, BLOOD: Lipase: 24 U/L (ref 11–51)

## 2019-03-09 LAB — LACTIC ACID, PLASMA: Lactic Acid, Venous: 1.2 mmol/L (ref 0.5–1.9)

## 2019-03-09 MED ORDER — ONDANSETRON HCL 4 MG/2ML IJ SOLN
4.0000 mg | Freq: Once | INTRAMUSCULAR | Status: AC
  Administered 2019-03-09: 12:00:00 4 mg via INTRAVENOUS
  Filled 2019-03-09: qty 2

## 2019-03-09 MED ORDER — DICYCLOMINE HCL 10 MG PO CAPS
20.0000 mg | ORAL_CAPSULE | Freq: Once | ORAL | Status: DC
  Filled 2019-03-09: qty 2

## 2019-03-09 MED ORDER — ONDANSETRON 4 MG PO TBDP: 4.0000 mg | ORAL_TABLET | Freq: Three times a day (TID) | ORAL | 0 refills | Status: DC | PRN

## 2019-03-09 MED ORDER — ACETAMINOPHEN 325 MG PO TABS
650.0000 mg | ORAL_TABLET | Freq: Once | ORAL | Status: AC
  Administered 2019-03-09: 650 mg via ORAL
  Filled 2019-03-09: qty 2

## 2019-03-09 MED ORDER — SODIUM CHLORIDE 0.9% FLUSH: 3.0000 mL | Freq: Once | INTRAVENOUS | Status: DC

## 2019-03-09 MED ORDER — MORPHINE SULFATE (PF) 4 MG/ML IV SOLN
4.0000 mg | Freq: Once | INTRAVENOUS | Status: AC
  Administered 2019-03-09: 4 mg via INTRAVENOUS
  Filled 2019-03-09: qty 1

## 2019-03-09 MED ORDER — DIPHENHYDRAMINE HCL 12.5 MG/5ML PO ELIX
12.5000 mg | ORAL_SOLUTION | Freq: Once | ORAL | Status: AC
  Administered 2019-03-09: 13:00:00 12.5 mg via ORAL

## 2019-03-09 MED ORDER — SODIUM CHLORIDE 0.9 % IV BOLUS
1000.0000 mL | Freq: Once | INTRAVENOUS | Status: AC
  Administered 2019-03-09: 1000 mL via INTRAVENOUS

## 2019-03-09 MED ORDER — CIPROFLOXACIN IN D5W 400 MG/200ML IV SOLN
400.0000 mg | Freq: Once | INTRAVENOUS | Status: AC
  Administered 2019-03-09: 12:00:00 400 mg via INTRAVENOUS
  Filled 2019-03-09: qty 200

## 2019-03-09 MED ORDER — METRONIDAZOLE IN NACL 5-0.79 MG/ML-% IV SOLN
500.0000 mg | Freq: Once | INTRAVENOUS | Status: AC
  Administered 2019-03-09: 500 mg via INTRAVENOUS
  Filled 2019-03-09: qty 100

## 2019-03-09 MED ORDER — DIPHENHYDRAMINE HCL 50 MG/ML IJ SOLN
INTRAMUSCULAR | Status: AC
  Filled 2019-03-09: qty 1

## 2019-03-09 MED ORDER — FAMOTIDINE 20 MG PO TABS: 20.0000 mg | ORAL_TABLET | Freq: Two times a day (BID) | ORAL | 0 refills | Status: DC | PRN

## 2019-03-09 MED ORDER — IOHEXOL 300 MG/ML  SOLN
100.0000 mL | Freq: Once | INTRAMUSCULAR | Status: AC | PRN
  Administered 2019-03-09: 14:00:00 100 mL via INTRAVENOUS

## 2019-03-09 MED ORDER — DICYCLOMINE HCL 20 MG PO TABS: 20.0000 mg | ORAL_TABLET | Freq: Three times a day (TID) | ORAL | 0 refills | Status: DC | PRN

## 2019-03-09 NOTE — ED Notes (Signed)
Reddening and itching of the right arm at IV site noted after IV cipro was started. Medication stopped upon finding, EDP made aware. Verbal order for 12.5 benadryl placed.

## 2019-03-09 NOTE — ED Notes (Signed)
Patient transported to Ultrasound 

## 2019-03-09 NOTE — ED Triage Notes (Signed)
Pt in with generalized abd pain and tenderness, states shooting pain along upper abdomen since last night. States she had green emesis last night and woke up with coffee ground contents in her mouth. Denies any diarrhea or fevers. Hx of GERD, still has gallbladder

## 2019-03-09 NOTE — Discharge Instructions (Addendum)
You were seen in the emergency department today for abdominal pain. Your ER work-up has been overall reassuring.  Your labs did not show substantial abnormalities.  Your imaging showed findings of enteritis which we suspect is the cause of your symptoms.  We are sending home with the following medicines: -Pepcid: Take every 12 hours as needed for stomach irritation, this is a medicine that helps with stomach acidity. -Bentyl: Take every 8 hours as needed for abdominal cramping -Zofran: This is a medicine to take every 8 hours as needed for nausea and vomiting.  We have prescribed you new medication(s) today. Discuss the medications prescribed today with your pharmacist as they can have adverse effects and interactions with your other medicines including over the counter and prescribed medications. Seek medical evaluation if you start to experience new or abnormal symptoms after taking one of these medicines, seek care immediately if you start to experience difficulty breathing, feeling of your throat closing, facial swelling, or rash as these could be indications of a more serious allergic reaction    Your CT scan showed a cystlike structure in your pelvic area therefore a pelvic ultrasound was obtained and revealed a cyst that had features concerning for possible cancer, for this reason you will need an outpatient MRI performed to further assess this cyst, this can be ordered by your OB/GYN, please call first thing tomorrow to schedule an appointment.   Please eat bland foods and be sure to stay well-hydrated.  Please follow-up with your primary care provider within 3 days for reevaluation.  Please return to the emergency department for new or worsening symptoms including but not limited to worsening pain, change in quality of pain, change in location of pain, inability to keep fluids down, blood in stool/vomit, dizziness, lightheadedness, passing out,  or any other concerns.    Results for orders  placed or performed during the hospital encounter of 03/09/19  SARS Coronavirus 2 [facility] order, Performed in [facility] hospital lab) Nasopharyngeal Nasopharyngeal Swab   Specimen: Nasopharyngeal Swab  Result Value Ref Range   SARS Coronavirus 2 NEGATIVE NEGATIVE  Lipase, blood  Result Value Ref Range   Lipase 24 11 - 51 U/L  Comprehensive metabolic panel  Result Value Ref Range   Sodium 135 135 - 145 mmol/L   Potassium 4.1 3.5 - 5.1 mmol/L   Chloride 105 98 - 111 mmol/L   CO2 22 22 - 32 mmol/L   Glucose, Bld 94 70 - 99 mg/dL   BUN 10 6 - 20 mg/dL   Creatinine, Ser 0.82 0.44 - 1.00 mg/dL   Calcium 8.8 (L) 8.9 - 10.3 mg/dL   Total Protein 7.8 6.5 - 8.1 g/dL   Albumin 3.7 3.5 - 5.0 g/dL   AST 24 15 - 41 U/L   ALT 25 0 - 44 U/L   Alkaline Phosphatase 39 38 - 126 U/L   Total Bilirubin 0.9 0.3 - 1.2 mg/dL   GFR calc non Af Amer >60 >60 mL/min   GFR calc Af Amer >60 >60 mL/min   Anion gap 8 5 - 15  CBC  Result Value Ref Range   WBC 6.5 4.0 - 10.5 K/uL   RBC 4.33 3.87 - 5.11 MIL/uL   Hemoglobin 13.6 12.0 - 15.0 g/dL   HCT 41.0 36.0 - 46.0 %   MCV 94.7 80.0 - 100.0 fL   MCH 31.4 26.0 - 34.0 pg   MCHC 33.2 30.0 - 36.0 g/dL   RDW 12.4 11.5 - 15.5 %  Platelets 312 150 - 400 K/uL   nRBC 0.0 0.0 - 0.2 %  Urinalysis, Routine w reflex microscopic  Result Value Ref Range   Color, Urine STRAW (A) YELLOW   APPearance CLEAR CLEAR   Specific Gravity, Urine 1.005 1.005 - 1.030   pH 6.0 5.0 - 8.0   Glucose, UA NEGATIVE NEGATIVE mg/dL   Hgb urine dipstick LARGE (A) NEGATIVE   Bilirubin Urine NEGATIVE NEGATIVE   Ketones, ur NEGATIVE NEGATIVE mg/dL   Protein, ur NEGATIVE NEGATIVE mg/dL   Nitrite NEGATIVE NEGATIVE   Leukocytes,Ua NEGATIVE NEGATIVE   RBC / HPF 6-10 0 - 5 RBC/hpf   WBC, UA 0-5 0 - 5 WBC/hpf   Bacteria, UA RARE (A) NONE SEEN   Squamous Epithelial / LPF 0-5 0 - 5  Lactic acid, plasma  Result Value Ref Range   Lactic Acid, Venous 1.2 0.5 - 1.9 mmol/L  APTT  Result  Value Ref Range   aPTT 30 24 - 36 seconds  Protime-INR  Result Value Ref Range   Prothrombin Time 13.6 11.4 - 15.2 seconds   INR 1.1 0.8 - 1.2  I-Stat beta hCG blood, ED  Result Value Ref Range   I-stat hCG, quantitative <5.0 <5 mIU/mL   Comment 3           Ct Abdomen Pelvis W Contrast  Result Date: 03/09/2019 CLINICAL DATA:  Abdominal pain since last night, fever EXAM: CT ABDOMEN AND PELVIS WITH CONTRAST TECHNIQUE: Multidetector CT imaging of the abdomen and pelvis was performed using the standard protocol following bolus administration of intravenous contrast. Sagittal and coronal MPR images reconstructed from axial data set. CONTRAST:  144mL OMNIPAQUE IOHEXOL 300 MG/ML SOLN IV. No oral contrast. COMPARISON:  None FINDINGS: Lower chest: Minimal dependent atelectasis at lung bases Hepatobiliary: Fatty infiltration of liver with focal fatty infiltration adjacent to falciform fissure. Gallbladder and liver otherwise unremarkable. Pancreas: Normal appearance Spleen: Normal appearance Adrenals/Urinary Tract: Adrenal glands, kidneys, ureters, and bladder normal appearance Stomach/Bowel: Normal appendix, retrocecal. Scattered stippled radiopacities within stool of the ascending and transverse colon compatible with ingested radiopaque foreign bodies. Stomach decompressed. Mild bowel wall thickening of a distal small bowel loop in the pelvis. Remaining bowel loops unremarkable. Vascular/Lymphatic: Aorta normal caliber.  No adenopathy. Reproductive: Uterus unremarkable. Probable visualization of a normal LEFT ovary. Large cystic structure within the cul-de-sac, 7.3 x 7.7 x 5.5 cm in size question of ovarian origin. Tampon in vagina. Other: No free air or free fluid. No additional inflammatory process or hernia. Musculoskeletal: Unremarkable IMPRESSION: Fatty infiltration of liver. Large cyst within cul-de-sac 7.7 cm greatest size, suspect of ovarian origin; pelvic and transvaginal ultrasound recommended for  further assessment. Mild wall thickening of a distal small bowel loop in the pelvis consistent with enteritis; differential diagnosis would include infection and inflammatory bowel disease. Electronically Signed   By: Lavonia Dana M.D.   On: 03/09/2019 13:56   US Pelvic Complete W Transvaginal And Torsion R/o  Result Date: 03/09/2019 CLINICAL DATA:  Ovarian cyst. EXAM: TRANSABDOMINAL AND TRANSVAGINAL ULTRASOUND OF PELVIS DOPPLER ULTRASOUND OF OVARIES TECHNIQUE: Both transabdominal and transvaginal ultrasound examinations of the pelvis were performed. Transabdominal technique was performed for global imaging of the pelvis including uterus, ovaries, adnexal regions, and pelvic cul-de-sac. It was necessary to proceed with endovaginal exam following the transabdominal exam to visualize the ovaries. Color and duplex Doppler ultrasound was utilized to evaluate blood flow to the ovaries. COMPARISON:  CT scan of same day. FINDINGS: Uterus Measurements: 10.5 x 6.3  x 3.3 cm = volume: 113 mL. No fibroids or other mass visualized. Endometrium Thickness: 6 mm which is within normal limits. No focal abnormality visualized. Right ovary Not visualized. Left ovary Measurements: 9.4 x 8.3 x 5.5 cm = volume: 224 mL. Large complex cyst with multiple thin septations is noted, measuring 8.7 x 7.0 x 4.6 cm. There is noted a solid peripheral component. Pulsed Doppler evaluation of left ovary demonstrates normal low-resistance arterial and venous waveforms. Other findings No abnormal free fluid. IMPRESSION: 8.7 cm multi-septated cyst is noted in left ovary with solid peripheral component. 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 Right ovary is not visualized. Electronically Signed   By: Marijo Conception M.D.   On: 03/09/2019 16:02   US Abdomen Limited Ruq  Result Date: 03/09/2019 CLINICAL DATA:  Right upper quadrant pain EXAM: ULTRASOUND ABDOMEN LIMITED RIGHT UPPER QUADRANT  COMPARISON:  None. FINDINGS: Gallbladder: Echogenic shadowing gallstones noted measuring up to 1.9 cm. Wall thickness normal measuring 1.2 mm. No Murphy's sign or pericholecystic fluid. No signs of cholecystitis. Common bile duct: Diameter: 2.7 mm Liver: Mild increased echogenicity compatible with hepatic steatosis. No focal abnormality or intrahepatic biliary dilatation. Portal vein is patent on color Doppler imaging with normal direction of blood flow towards the liver. Other: No free fluid or ascites IMPRESSION: Cholelithiasis without signs of cholecystitis. Hepatic steatosis Electronically Signed   By: Jerilynn Mages.  Shick M.D.   On: 03/09/2019 11:48

## 2019-03-09 NOTE — ED Provider Notes (Signed)
[facility] EMERGENCY DEPARTMENT Provider Note   CSN: [id number] Arrival date & time: 03/09/19  [id number]     History   Chief Complaint Chief Complaint  Patient presents with  . Abdominal Pain  . Emesis    HPI Normagene Monasterio is a 38 y.o. female with a hx of anemia, anxiety, depression, GERD, & prior C-sections who presents to the ED w/ complaints of abdominal pain that began @ 20:30 last night. Patient states pain started as generalized cramping but has seem to localize to the upper abdomen. Discomfort is constant with intermittent increases in intense sharp/shooting pain, current pain at rest is a 3/10 in severity, worse with movement & going over bumps in the road in the car. No alleviating factors. No meds PTA. Reports associated nausea w/ 1 episode of non bloody emesis. States this AM she did wake up with black specks on her teeth like she had eaten oreos which has not re-occurred. Denies fever, chills, hematemesis, diarrhea, melena, or constipation. Last BM yesterday was relatively normal. Currently menstruating.      HPI  Past Medical History:  Diagnosis Date  . Allergy   . Anemia   . Anxiety   . Complication of anesthesia   . Depression    wellbutrin in past  . GERD (gastroesophageal reflux disease)   . PONV (postoperative nausea and vomiting)     Patient Active Problem List   Diagnosis Date Noted  . Postoperative state 08/24/2017  . Hereditary disease in family possibly affecting fetus, affecting management of mother in pregnancy 04/15/2017  . [redacted] weeks gestation of pregnancy   . Low back pain radiating to right leg 01/08/2011  . CONTACT DERMATITIS&OTHER ECZEMA DUE UNSPEC CAUSE 12/25/2009    Past Surgical History:  Procedure Laterality Date  . CESAREAN SECTION    . CESAREAN SECTION N/A 08/24/2017   Procedure: CESAREAN SECTION;  Surgeon: Bobbye Charleston, MD;  Location: [place];  Service: Obstetrics;  Laterality: N/A;  Tracey RNFA  .  NASAL SEPTOPLASTY W/ TURBINOPLASTY    . WISDOM TOOTH EXTRACTION       OB History    Gravida  2   Para  2   Term  2   Preterm      AB      Living  2     SAB      TAB      Ectopic      Multiple  0   Live Births  2            Home Medications    Prior to Admission medications   Medication Sig Start Date End Date Taking? Authorizing Provider  calcium carbonate (TUMS - DOSED IN MG ELEMENTAL CALCIUM) 500 MG chewable tablet Chew 1 tablet by mouth 3 (three) times daily as needed for indigestion or heartburn.    [provider]  docusate sodium (COLACE) 100 MG capsule Take 1 capsule (100 mg total) by mouth 2 (two) times daily. 08/26/17   Vanessa Kick, MD  fluticasone [facility]) 50 MCG/ACT nasal spray Place 2 sprays into both nostrils daily. Patient taking differently: Place 1 spray into both nostrils daily.  10/14/13   Orma Flaming, MD  ibuprofen (ADVIL,MOTRIN) 600 MG tablet Take 1 tablet (600 mg total) by mouth every 6 (six) hours as needed. 08/26/17   Vanessa Kick, MD  oxyCODONE-acetaminophen (PERCOCET/ROXICET) 5-325 MG tablet Take 1-2 tablets by mouth every 6 (six) hours as needed for severe pain. 08/26/17  Vanessa Kick, MD  Prenatal Vit-Fe Fumarate-FA (PRENATAL VITAMIN PO) Take 1 tablet by mouth daily.     [provider]    Family History Family History  Problem Relation Age of Onset  . Hyperlipidemia Father   . Hypertension Father   . Fibromyalgia Father   . Alcohol abuse Father   . Hyperlipidemia Maternal Grandmother   . Hypertension Maternal Grandmother   . Stroke Maternal Grandmother   . Osteoporosis Maternal Grandmother   . Diabetes Maternal Grandfather   . Hyperlipidemia Maternal Grandfather   . Hypertension Maternal Grandfather   . Hypertension Paternal Grandmother   . Arthritis Paternal Grandmother   . Cancer Paternal Grandmother   . Cancer - Other Paternal Grandmother   . Fibromyalgia Mother   . Melanoma Mother   . Lupus Sister    . Sudden death Neg Hx     Social History Social History   Tobacco Use  . Smoking status: Former Smoker    Types: Cigarettes    Quit date: 01/06/2006    Years since quitting: 13.1  . Smokeless tobacco: Never Used  Substance Use Topics  . Alcohol use: No    Alcohol/week: 2.0 - 3.0 standard drinks    Types: 2 - 3 Glasses of wine per week    Frequency: Never    Comment: none with preg  . Drug use: No     Allergies   Penicillins and Sertraline hcl   Review of Systems Review of Systems  Constitutional: Negative for chills and fever.  Respiratory: Negative for shortness of breath.   Cardiovascular: Negative for chest pain.  Gastrointestinal: Positive for abdominal pain, nausea and vomiting. Negative for anal bleeding, blood in stool, constipation and diarrhea.  Genitourinary: Positive for vaginal bleeding (currently menstruating). Negative for dysuria and vaginal discharge.  Neurological: Negative for syncope.  All other systems reviewed and are negative.    Physical Exam Updated Vital Signs BP 113/63 (BP Location: Right Arm)   Pulse (!) 116   Temp 99 F (37.2 C) (Oral)   Resp 18   Wt 113.9 kg   LMP 03/04/2019   SpO2 95%   BMI 40.53 kg/m   Physical Exam Vitals signs and nursing note reviewed.  Constitutional:      General: She is not in acute distress.    Appearance: She is well-developed. She is not toxic-appearing.  HENT:     Head: Normocephalic and atraumatic.  Eyes:     General:        Right eye: No discharge.        Left eye: No discharge.     Conjunctiva/sclera: Conjunctivae normal.  Neck:     Musculoskeletal: Neck supple.  Cardiovascular:     Rate and Rhythm: Regular rhythm. Tachycardia present.  Pulmonary:     Effort: Pulmonary effort is normal. No respiratory distress.     Breath sounds: Normal breath sounds. No wheezing, rhonchi or rales.  Abdominal:     General: There is no distension.     Palpations: Abdomen is soft.     Tenderness: There  is abdominal tenderness in the right upper quadrant, epigastric area and left upper quadrant. There is no right CVA tenderness, left CVA tenderness, guarding or rebound. Positive signs include Murphy's sign. Negative signs include McBurney's sign.  Skin:    General: Skin is warm and dry.     Findings: No rash.  Neurological:     Mental Status: She is alert.     Comments: Clear speech.  Psychiatric:        Behavior: Behavior normal.    ED Treatments / Results  Labs (all labs ordered are listed, but only abnormal results are displayed) Labs Reviewed  COMPREHENSIVE METABOLIC PANEL - Abnormal; Notable for the following components:      Result Value   Calcium 8.8 (*)    All other components within normal limits  URINALYSIS, ROUTINE W REFLEX MICROSCOPIC - Abnormal; Notable for the following components:   Color, Urine STRAW (*)    Hgb urine dipstick LARGE (*)    Bacteria, UA RARE (*)    All other components within normal limits  SARS CORONAVIRUS 2 (HOSPITAL ORDER, PERFORMED IN [facility])  CULTURE, BLOOD (ROUTINE X 2)  CULTURE, BLOOD (ROUTINE X 2)  LIPASE, BLOOD  CBC  LACTIC ACID, PLASMA  APTT  PROTIME-INR  LACTIC ACID, PLASMA  I-STAT BETA HCG BLOOD, ED ([facility], [facility], [facility] ONLY)    EKG EKG Interpretation  Date/Time:  Tuesday March 09 2019 11:18:03 EDT Ventricular Rate:  101 PR Interval:  142 QRS Duration: 86 QT Interval:  338 QTC Calculation: 438 R Axis:   78 Text Interpretation:  Sinus tachycardia Otherwise normal ECG Confirmed by Gerlene Fee [phone]) on 03/09/2019 2:16:04 PM   Radiology Ct Abdomen Pelvis W Contrast  Result Date: 03/09/2019 CLINICAL DATA:  Abdominal pain since last night, fever EXAM: CT ABDOMEN AND PELVIS WITH CONTRAST TECHNIQUE: Multidetector CT imaging of the abdomen and pelvis was performed using the standard protocol following bolus administration of intravenous contrast. Sagittal and coronal MPR images reconstructed from axial data set.  CONTRAST:  137mL OMNIPAQUE IOHEXOL 300 MG/ML SOLN IV. No oral contrast. COMPARISON:  None FINDINGS: Lower chest: Minimal dependent atelectasis at lung bases Hepatobiliary: Fatty infiltration of liver with focal fatty infiltration adjacent to falciform fissure. Gallbladder and liver otherwise unremarkable. Pancreas: Normal appearance Spleen: Normal appearance Adrenals/Urinary Tract: Adrenal glands, kidneys, ureters, and bladder normal appearance Stomach/Bowel: Normal appendix, retrocecal. Scattered stippled radiopacities within stool of the ascending and transverse colon compatible with ingested radiopaque foreign bodies. Stomach decompressed. Mild bowel wall thickening of a distal small bowel loop in the pelvis. Remaining bowel loops unremarkable. Vascular/Lymphatic: Aorta normal caliber.  No adenopathy. Reproductive: Uterus unremarkable. Probable visualization of a normal LEFT ovary. Large cystic structure within the cul-de-sac, 7.3 x 7.7 x 5.5 cm in size question of ovarian origin. Tampon in vagina. Other: No free air or free fluid. No additional inflammatory process or hernia. Musculoskeletal: Unremarkable IMPRESSION: Fatty infiltration of liver. Large cyst within cul-de-sac 7.7 cm greatest size, suspect of ovarian origin; pelvic and transvaginal ultrasound recommended for further assessment. Mild wall thickening of a distal small bowel loop in the pelvis consistent with enteritis; differential diagnosis would include infection and inflammatory bowel disease. Electronically Signed   By: Lavonia Dana M.D.   On: 03/09/2019 13:56   US Pelvic Complete W Transvaginal And Torsion R/o  Result Date: 03/09/2019 CLINICAL DATA:  Ovarian cyst. EXAM: TRANSABDOMINAL AND TRANSVAGINAL ULTRASOUND OF PELVIS DOPPLER ULTRASOUND OF OVARIES TECHNIQUE: Both transabdominal and transvaginal ultrasound examinations of the pelvis were performed. Transabdominal technique was performed for global imaging of the pelvis including uterus,  ovaries, adnexal regions, and pelvic cul-de-sac. It was necessary to proceed with endovaginal exam following the transabdominal exam to visualize the ovaries. Color and duplex Doppler ultrasound was utilized to evaluate blood flow to the ovaries. COMPARISON:  CT scan of same day. FINDINGS: Uterus Measurements: 10.5 x 6.3 x 3.3 cm = volume: 113 mL. No  fibroids or other mass visualized. Endometrium Thickness: 6 mm which is within normal limits. No focal abnormality visualized. Right ovary Not visualized. Left ovary Measurements: 9.4 x 8.3 x 5.5 cm = volume: 224 mL. Large complex cyst with multiple thin septations is noted, measuring 8.7 x 7.0 x 4.6 cm. There is noted a solid peripheral component. Pulsed Doppler evaluation of left ovary demonstrates normal low-resistance arterial and venous waveforms. Other findings No abnormal free fluid. IMPRESSION: 8.7 cm multi-septated cyst is noted in left ovary with solid peripheral component. 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 Right ovary is not visualized. Electronically Signed   By: Marijo Conception M.D.   On: 03/09/2019 16:02   US Abdomen Limited Ruq  Result Date: 03/09/2019 CLINICAL DATA:  Right upper quadrant pain EXAM: ULTRASOUND ABDOMEN LIMITED RIGHT UPPER QUADRANT COMPARISON:  None. FINDINGS: Gallbladder: Echogenic shadowing gallstones noted measuring up to 1.9 cm. Wall thickness normal measuring 1.2 mm. No Murphy's sign or pericholecystic fluid. No signs of cholecystitis. Common bile duct: Diameter: 2.7 mm Liver: Mild increased echogenicity compatible with hepatic steatosis. No focal abnormality or intrahepatic biliary dilatation. Portal vein is patent on color Doppler imaging with normal direction of blood flow towards the liver. Other: No free fluid or ascites IMPRESSION: Cholelithiasis without signs of cholecystitis. Hepatic steatosis Electronically Signed   By: Jerilynn Mages.  Shick M.D.   On: 03/09/2019 11:48     Procedures Procedures (including critical care time)  Medications Ordered in ED Medications  sodium chloride flush (NS) 0.9 % injection 3 mL (3 mLs Intravenous Not Given 03/09/19 1019)  diphenhydrAMINE (BENADRYL) 50 MG/ML injection (has no administration in time range)  dicyclomine (BENTYL) capsule 20 mg (has no administration in time range)  ondansetron (ZOFRAN) injection 4 mg (4 mg Intravenous Given 03/09/19 1213)  morphine 4 MG/ML injection 4 mg (4 mg Intravenous Given 03/09/19 1214)  sodium chloride 0.9 % bolus 1,000 mL (0 mLs Intravenous Stopped 03/09/19 1605)  ciprofloxacin (CIPRO) IVPB 400 mg (0 mg Intravenous Stopped 03/09/19 1235)  metroNIDAZOLE (FLAGYL) IVPB 500 mg (0 mg Intravenous Stopped 03/09/19 1315)  acetaminophen (TYLENOL) tablet 650 mg (650 mg Oral Given 03/09/19 1202)  diphenhydrAMINE (BENADRYL) 12.5 MG/5ML elixir 12.5 mg (12.5 mg Oral Given 03/09/19 1240)  iohexol (OMNIPAQUE) 300 MG/ML solution 100 mL (100 mLs Intravenous Contrast Given 03/09/19 1336)     Initial Impression / Assessment and Plan / ED Course  I have reviewed the triage vital signs and the nursing notes.  Pertinent labs & imaging results that were available during my care of the patient were reviewed by me and considered in my medical decision making (see chart for details).    Patient presents to the ED with complaints of abdominal pain. ER work-up per triage reviewed:  CBC: no leukocytosis or anemia.  CMP: Mild hypocalcemia, no significant electrolyte derangement. LFTs & renal function WNL.  Lipase: WNL UA: Hematuria consistent w/ hx of menses- no UTI Preg test: Negative.    Patient nontoxic appearing, in no apparent distress, vitals w/ fever & tachycardia.  Abdomen w/ tenderness to the upper abdomen, + murphys, no rebound/guarding. Primary concern for cholecystitis, additional DDX: cholangitis, symptomatic cholecystitis, appendicitis, perf/obstruction, viral GI illness. Pancreatitis unlikely w/ normal  lipase. She mentioned some coffee ground content on her teeth this morning but did not have any hematemesis- no anemia, no BUN elevation- doubt significant upper GI bleed. Given meets SIRS criteria will activate code sepsis- blood cultures & lactic acid  added on, start cipro/flagyl given concern for intra-abdominal infection, PCN allergic therefore avoided rocephin. Analgesics, anti-emetics, & fluids ordered. Tylenol for fever.   Lactic acid: WNL COVID: Negative RUQ [place]:  Cholelithiasis without signs of cholecystitis. Hepatic steatosis  --> proceed w/ CT A/P  CT A/P: Fatty infiltration of liver. Large cyst within cul-de-sac 7.7 cm greatest size, suspect of ovarian origin; pelvic and transvaginal ultrasound recommended for further assessment. Mild wall thickening of a distal small bowel loop in the pelvis consistent with enteritis; differential diagnosis would include infection and inflammatory bowel disease.   US obtained: 8.7 cm multi-septated cyst is noted in left ovary with solid peripheral component. 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 Right ovary is not visualized.  On re-assessment patient is feeling somewhat improved, she is able to tolerate p.o., her heart rate is improved to the mid 80s additional vitals remain stable.  ---> Suspect patient's multiseptated cyst is more of an incidental finding, she has no lower abdominal tenderness on exam therefore pelvic was deferred-she will need outpatient MRI, we discussed this at length, she has an OB/GYN that she is plan to call tomorrow for follow-up of this..  Overall suspect patient's sxs are related to viral enteritis given an overall reassuring work-up in the ER. Symptomatically improved, tolerating PO.  Will discharge home with supportive care in this regard. I discussed results, treatment plan, need for follow-up, and return precautions with the patient. Provided opportunity for questions,  patient confirmed understanding and is in agreement with plan.   This is a shared visit with supervising physician Dr. Sedonia Small who has independently evaluated patient & provided guidance in evaluation/management/disposition, in agreement with care    Final Clinical Impressions(s) / ED Diagnoses   Final diagnoses:  Abdominal pain  Enteritis  Abnormal ultrasound    ED Discharge Orders         Ordered    ondansetron (ZOFRAN ODT) 4 MG disintegrating tablet  Every 8 hours PRN     03/09/19 1600    dicyclomine (BENTYL) 20 MG tablet  Every 8 hours PRN     03/09/19 1600    famotidine (PEPCID) 20 MG tablet  2 times daily PRN     03/09/19 1600           Silviano Neuser, [place], PA-C 03/09/19 1617    Maudie Flakes, MD 03/12/19 1551

## 2019-03-09 NOTE — ED Notes (Signed)
Patient Alert and oriented to baseline. Stable and ambulatory to baseline. Patient verbalized understanding of the discharge instructions.  Patient belongings were taken by the patient.   

## 2019-03-14 LAB — CULTURE, BLOOD (ROUTINE X 2)
Culture: NO GROWTH
Culture: NO GROWTH
Special Requests: ADEQUATE

## 2019-04-24 ENCOUNTER — Inpatient Hospital Stay (HOSPITAL_COMMUNITY): Admission: RE | Admit: 2019-04-24 | Payer: BC Managed Care – PPO | Source: Ambulatory Visit

## 2019-04-24 NOTE — Progress Notes (Signed)
Patient had a positive result on 04/10/19 at [facility] facility, patient will bring a copy of the result the day of surgery, no need to retest as long as the patient brings a copy of the result

## 2019-04-25 ENCOUNTER — Other Ambulatory Visit: Payer: Self-pay | Admitting: Obstetrics and Gynecology

## 2019-04-25 NOTE — H&P (Signed)
38 y.o. [id number] with US showed 9 cm LO complex cystic mass with septations and solid component. Normal blood flow seen. Uncertain if this was causing any of her symptoms but the mass has 3 characteristics that are concerning- large, complex and solid component. On the other hand, it does not have increased blood flow, there is no FF, and all labs were normal. D/w this needs to come out and Dr. Denman George that it is ok for me to do. Will do Robotic LSO, possible BSO, and likely do B salpingectomies for both birth control and for reducing future risk of ovarian ca. Pt understands and agrees with plan. AN done with preop.   Past Medical History:  Diagnosis Date  . Allergy   . Anemia   . Anxiety   . Complication of anesthesia   . Depression    wellbutrin in past  . GERD (gastroesophageal reflux disease)   . PONV (postoperative nausea and vomiting)    Past Surgical History:  Procedure Laterality Date  . CESAREAN SECTION    . CESAREAN SECTION N/A 08/24/2017   Procedure: CESAREAN SECTION;  Surgeon: Bobbye Charleston, MD;  Location: [place];  Service: Obstetrics;  Laterality: N/A;  Tracey RNFA  . NASAL SEPTOPLASTY W/ TURBINOPLASTY    . WISDOM TOOTH EXTRACTION      Social History   Socioeconomic History  . Marital status: Married    Spouse name: Not on file  . Number of children: Not on file  . Years of education: Not on file  . Highest education level: Not on file  Occupational History  . Not on file  Social Needs  . Financial resource strain: Not on file  . Food insecurity    Worry: Not on file    Inability: Not on file  . Transportation needs    Medical: Not on file    Non-medical: Not on file  Tobacco Use  . Smoking status: Former Smoker    Types: Cigarettes    Quit date: 01/06/2006    Years since quitting: 13.3  . Smokeless tobacco: Never Used  Substance and Sexual Activity  . Alcohol use: No    Alcohol/week: 2.0 - 3.0 standard drinks    Types: 2 - 3 Glasses of wine  per week    Frequency: Never    Comment: none with preg  . Drug use: No  . Sexual activity: Yes  Lifestyle  . Physical activity    Days per week: Not on file    Minutes per session: Not on file  . Stress: Not on file  Relationships  . Social Herbalist on phone: Not on file    Gets together: Not on file    Attends religious service: Not on file    Active member of club or organization: Not on file    Attends meetings of clubs or organizations: Not on file    Relationship status: Not on file  . Intimate partner violence    Fear of current or ex partner: Not on file    Emotionally abused: Not on file    Physically abused: Not on file    Forced sexual activity: Not on file  Other Topics Concern  . Not on file  Social History Narrative  . Not on file    No current facility-administered medications on file prior to encounter.    Current Outpatient Medications on File Prior to Encounter  Medication Sig Dispense Refill  . calcium carbonate (TUMS -  DOSED IN MG ELEMENTAL CALCIUM) 500 MG chewable tablet Chew 1 tablet by mouth 3 (three) times daily as needed for indigestion or heartburn.    . dicyclomine (BENTYL) 20 MG tablet Take 1 tablet (20 mg total) by mouth every 8 (eight) hours as needed for spasms. 30 tablet 0  . docusate sodium (COLACE) 100 MG capsule Take 1 capsule (100 mg total) by mouth 2 (two) times daily. 60 capsule 0  . famotidine (PEPCID) 20 MG tablet Take 1 tablet (20 mg total) by mouth 2 (two) times daily as needed for heartburn or indigestion. 30 tablet 0  . fluticasone (FLONASE) 50 MCG/ACT nasal spray Place 2 sprays into both nostrils daily. (Patient taking differently: Place 1 spray into both nostrils daily. ) 16 g 6  . ibuprofen (ADVIL,MOTRIN) 600 MG tablet Take 1 tablet (600 mg total) by mouth every 6 (six) hours as needed. 90 tablet 0  . ondansetron (ZOFRAN ODT) 4 MG disintegrating tablet Take 1 tablet (4 mg total) by mouth every 8 (eight) hours as needed  for nausea or vomiting. 5 tablet 0  . oxyCODONE-acetaminophen (PERCOCET/ROXICET) 5-325 MG tablet Take 1-2 tablets by mouth every 6 (six) hours as needed for severe pain. 30 tablet 0  . Prenatal Vit-Fe Fumarate-FA (PRENATAL VITAMIN PO) Take 1 tablet by mouth daily.       Allergies  Allergen Reactions  . Penicillins Hives    Has patient had a PCN reaction causing immediate rash, facial/tongue/throat swelling, SOB or lightheadedness with hypotension: unknown Has patient had a PCN reaction causing severe rash involving mucus membranes or skin necrosis: unknown Has patient had a PCN reaction that required hospitalization: no  Has patient had a PCN reaction occurring within the last 10 years: no If all of the above answers are "NO", then may proceed with Cephalosporin use.   . Sertraline Hcl     [place]     There were no vitals filed for this visit.  Lungs: clear to ascultation Cor:  RRR Abdomen:  soft, nontender, nondistended. Ex:  no cords, erythema Pelvic:  Female Genitalia: Vulva: no masses, atrophy, or lesions. Vagina: no tenderness, erythema, cystocele, rectocele, abnormal vaginal discharge, or vesicle(s) or ulcers. Cervix: no discharge or cervical motion tenderness and grossly normal. Uterus: normal shape and size (7) and midline, non-tender, and no uterine prolapse. Bladder/Urethra: no urethral discharge or mass and normal meatus, bladder non distended, and Urethra well supported. Adnexa/Parametria: no parametrial tenderness or mass and no adnexal tenderness or ovarian mass.   A:  9 cm complex L ovarian cyst.  For robotic LSO and R salpingectomy.   P: P: All risks, benefits and alternatives d/w patient and she desires to proceed.  Patient has undergone a modified diet, ERAS protocol and will receive preop antibiotics and SCDs during the operation.   Pt to have extended recovery but will go home same day if eating, ambulating, voiding and pain control is good.  Teresa [name]

## 2019-04-26 ENCOUNTER — Encounter (HOSPITAL_BASED_OUTPATIENT_CLINIC_OR_DEPARTMENT_OTHER): Payer: Self-pay | Admitting: *Deleted

## 2019-04-26 ENCOUNTER — Other Ambulatory Visit: Payer: Self-pay

## 2019-04-26 NOTE — Anesthesia Preprocedure Evaluation (Addendum)
Anesthesia Evaluation  Patient identified by MRN, date of birth, ID band Patient awake    Reviewed: Allergy & Precautions, NPO status , Patient's Chart, lab work & pertinent test results  History of Anesthesia Complications (+) PONV  Airway Mallampati: II  TM Distance: >3 FB Neck ROM: Full    Dental no notable dental hx. (+) Teeth Intact   Pulmonary former smoker,    Pulmonary exam normal breath sounds clear to auscultation       Cardiovascular Exercise Tolerance: Good Normal cardiovascular exam Rhythm:Regular Rate:Normal     Neuro/Psych PSYCHIATRIC DISORDERS Depression    GI/Hepatic Neg liver ROS, GERD  ,  Endo/Other  negative endocrine ROS  Renal/GU negative Renal ROS     Musculoskeletal negative musculoskeletal ROS (+)   Abdominal (+) + obese,   Peds  Hematology Hgb 11.7 Plt 287   Anesthesia Other Findings   Reproductive/Obstetrics negative OB ROS                            Anesthesia Physical Anesthesia Plan  ASA: II  Anesthesia Plan: General   Post-op Pain Management:    Induction: Intravenous  PONV Risk Score and Plan: 4 or greater and Treatment may vary due to age or medical condition, Ondansetron, Dexamethasone, Scopolamine patch - Pre-op and Midazolam  Airway Management Planned: Oral ETT  Additional Equipment:   Intra-op Plan:   Post-operative Plan: Extubation in OR  Informed Consent: I have reviewed the patients History and Physical, chart, labs and discussed the procedure including the risks, benefits and alternatives for the proposed anesthesia with the patient or authorized representative who has indicated his/her understanding and acceptance.     Dental advisory given  Plan Discussed with: CRNA  Anesthesia Plan Comments: (GA w Ett and 0.3 Mcg/kg Dexmedotomidine)       Anesthesia Quick Evaluation

## 2019-04-26 NOTE — Progress Notes (Signed)
Spoke w/ via phone for pre-op interview---Teresa [name] needs dos---- cbc, type and screen, urine pregnancy             [name] results------ COVID test ------positive Apr 10, 2019, patient to bring copy Arrive at -------530 am NPO after ------midnight Medications to take morning of surgery -----flonase, wellbutrin, omeprazole, zyrtec, birth control pill Diabetic medication -----n/a Patient Special Instructions ----- Pre-Op special Istructions ----- Patient verbalized understanding of instructions that were given at this phone interview. Patient denies shortness of breath, chest pain, fever, cough a this phone interview.

## 2019-04-27 ENCOUNTER — Other Ambulatory Visit: Payer: Self-pay

## 2019-04-27 ENCOUNTER — Ambulatory Visit (HOSPITAL_BASED_OUTPATIENT_CLINIC_OR_DEPARTMENT_OTHER): Payer: BC Managed Care – PPO | Admitting: Anesthesiology

## 2019-04-27 ENCOUNTER — Ambulatory Visit (HOSPITAL_BASED_OUTPATIENT_CLINIC_OR_DEPARTMENT_OTHER)
Admission: RE | Admit: 2019-04-27 | Discharge: 2019-04-27 | Disposition: A | Discharge status: Home / Self Care | Payer: BC Managed Care – PPO | Attending: Obstetrics and Gynecology | Admitting: Obstetrics and Gynecology

## 2019-04-27 ENCOUNTER — Encounter (HOSPITAL_BASED_OUTPATIENT_CLINIC_OR_DEPARTMENT_OTHER): Payer: Self-pay | Admitting: Emergency Medicine

## 2019-04-27 ENCOUNTER — Encounter (HOSPITAL_BASED_OUTPATIENT_CLINIC_OR_DEPARTMENT_OTHER)
Admission: RE | Disposition: A | Discharge status: Home / Self Care | Payer: Self-pay | Source: Home / Self Care | Attending: Obstetrics and Gynecology

## 2019-04-27 DIAGNOSIS — Z880 Allergy status to penicillin: Secondary | ICD-10-CM | POA: Insufficient documentation

## 2019-04-27 DIAGNOSIS — Z87891 Personal history of nicotine dependence: Secondary | ICD-10-CM | POA: Insufficient documentation

## 2019-04-27 DIAGNOSIS — D270 Benign neoplasm of right ovary: Secondary | ICD-10-CM | POA: Diagnosis not present

## 2019-04-27 DIAGNOSIS — Z79899 Other long term (current) drug therapy: Secondary | ICD-10-CM | POA: Insufficient documentation

## 2019-04-27 DIAGNOSIS — K219 Gastro-esophageal reflux disease without esophagitis: Secondary | ICD-10-CM | POA: Diagnosis not present

## 2019-04-27 DIAGNOSIS — K660 Peritoneal adhesions (postprocedural) (postinfection): Secondary | ICD-10-CM | POA: Insufficient documentation

## 2019-04-27 DIAGNOSIS — N83209 Unspecified ovarian cyst, unspecified side: Secondary | ICD-10-CM | POA: Diagnosis present

## 2019-04-27 HISTORY — DX: Unspecified ovarian cyst, right side: N83.201

## 2019-04-27 HISTORY — PX: ROBOTIC ASSISTED SALPINGO OOPHERECTOMY: SHX6082

## 2019-04-27 HISTORY — DX: Calculus of gallbladder without cholecystitis without obstruction: K80.20

## 2019-04-27 LAB — CBC
HCT: 36.1 % (ref 36.0–46.0)
Hemoglobin: 11.7 g/dL — ABNORMAL LOW (ref 12.0–15.0)
MCH: 30.9 pg (ref 26.0–34.0)
MCHC: 32.4 g/dL (ref 30.0–36.0)
MCV: 95.3 fL (ref 80.0–100.0)
Platelets: 287 10*3/uL (ref 150–400)
RBC: 3.79 MIL/uL — ABNORMAL LOW (ref 3.87–5.11)
RDW: 12.7 % (ref 11.5–15.5)
WBC: 7.7 10*3/uL (ref 4.0–10.5)
nRBC: 0 % (ref 0.0–0.2)

## 2019-04-27 LAB — ABO/RH: ABO/RH(D): A POS

## 2019-04-27 LAB — TYPE AND SCREEN
ABO/RH(D): A POS
Antibody Screen: NEGATIVE

## 2019-04-27 LAB — POCT PREGNANCY, URINE: Preg Test, Ur: NEGATIVE

## 2019-04-27 SURGERY — SALPINGO-OOPHORECTOMY, ROBOT-ASSISTED
Anesthesia: General | Site: Abdomen

## 2019-04-27 MED ORDER — KETAMINE HCL 10 MG/ML IJ SOLN
INTRAMUSCULAR | Status: DC | PRN
  Administered 2019-04-27: 30 mg via INTRAVENOUS

## 2019-04-27 MED ORDER — LIDOCAINE 2% (20 MG/ML) 5 ML SYRINGE
INTRAMUSCULAR | Status: DC | PRN
  Administered 2019-04-27: 100 mg via INTRAVENOUS

## 2019-04-27 MED ORDER — PROPOFOL 10 MG/ML IV BOLUS
INTRAVENOUS | Status: DC | PRN
  Administered 2019-04-27: 20 mg via INTRAVENOUS
  Administered 2019-04-27: 30 mg via INTRAVENOUS
  Administered 2019-04-27: 150 mg via INTRAVENOUS

## 2019-04-27 MED ORDER — FENTANYL CITRATE (PF) 100 MCG/2ML IJ SOLN
INTRAMUSCULAR | Status: DC | PRN
  Administered 2019-04-27 (×4): 50 ug via INTRAVENOUS
  Administered 2019-04-27: 100 ug via INTRAVENOUS
  Administered 2019-04-27: 50 ug via INTRAVENOUS

## 2019-04-27 MED ORDER — OXYCODONE-ACETAMINOPHEN 5-325 MG PO TABS
1.0000 | ORAL_TABLET | Freq: Once | ORAL | Status: AC
  Administered 2019-04-27: 11:00:00 1 via ORAL
  Filled 2019-04-27: qty 1

## 2019-04-27 MED ORDER — GABAPENTIN 300 MG PO CAPS
300.0000 mg | ORAL_CAPSULE | Freq: Once | ORAL | Status: AC
  Administered 2019-04-27: 06:00:00 300 mg via ORAL
  Filled 2019-04-27: qty 1

## 2019-04-27 MED ORDER — ONDANSETRON HCL 4 MG/2ML IJ SOLN
INTRAMUSCULAR | Status: AC
  Filled 2019-04-27: qty 2

## 2019-04-27 MED ORDER — SCOPOLAMINE 1 MG/3DAYS TD PT72
1.0000 | MEDICATED_PATCH | TRANSDERMAL | Status: DC
  Administered 2019-04-27: 1.5 mg via TRANSDERMAL
  Filled 2019-04-27: qty 1

## 2019-04-27 MED ORDER — KETOROLAC TROMETHAMINE 30 MG/ML IJ SOLN
INTRAMUSCULAR | Status: DC | PRN
  Administered 2019-04-27: 30 mg via INTRAVENOUS

## 2019-04-27 MED ORDER — FENTANYL CITRATE (PF) 250 MCG/5ML IJ SOLN
INTRAMUSCULAR | Status: AC
  Filled 2019-04-27: qty 5

## 2019-04-27 MED ORDER — MIDAZOLAM HCL 2 MG/2ML IJ SOLN
INTRAMUSCULAR | Status: DC | PRN
  Administered 2019-04-27: 2 mg via INTRAVENOUS

## 2019-04-27 MED ORDER — OXYCODONE-ACETAMINOPHEN 5-325 MG PO TABS
ORAL_TABLET | ORAL | Status: AC
  Filled 2019-04-27: qty 1

## 2019-04-27 MED ORDER — DEXAMETHASONE SODIUM PHOSPHATE 10 MG/ML IJ SOLN
INTRAMUSCULAR | Status: AC
  Filled 2019-04-27: qty 1

## 2019-04-27 MED ORDER — ROCURONIUM BROMIDE 10 MG/ML (PF) SYRINGE
PREFILLED_SYRINGE | INTRAVENOUS | Status: AC
  Filled 2019-04-27: qty 10

## 2019-04-27 MED ORDER — MIDAZOLAM HCL 2 MG/2ML IJ SOLN
INTRAMUSCULAR | Status: AC
  Filled 2019-04-27: qty 2

## 2019-04-27 MED ORDER — SOD CITRATE-CITRIC ACID 500-334 MG/5ML PO SOLN
30.0000 mL | ORAL | Status: DC
  Filled 2019-04-27: qty 30

## 2019-04-27 MED ORDER — LACTATED RINGERS IV SOLN
INTRAVENOUS | Status: DC
  Administered 2019-04-27 (×2): via INTRAVENOUS
  Filled 2019-04-27: qty 1000

## 2019-04-27 MED ORDER — KETOROLAC TROMETHAMINE 30 MG/ML IJ SOLN
INTRAMUSCULAR | Status: AC
  Filled 2019-04-27: qty 1

## 2019-04-27 MED ORDER — FENTANYL CITRATE (PF) 100 MCG/2ML IJ SOLN
INTRAMUSCULAR | Status: AC
  Filled 2019-04-27: qty 2

## 2019-04-27 MED ORDER — ARTIFICIAL TEARS OPHTHALMIC OINT
TOPICAL_OINTMENT | OPHTHALMIC | Status: AC
  Filled 2019-04-27: qty 3.5

## 2019-04-27 MED ORDER — ONDANSETRON HCL 4 MG/2ML IJ SOLN
4.0000 mg | Freq: Once | INTRAMUSCULAR | Status: DC | PRN
  Filled 2019-04-27: qty 2

## 2019-04-27 MED ORDER — HYDROMORPHONE HCL 1 MG/ML IJ SOLN
INTRAMUSCULAR | Status: AC
  Filled 2019-04-27: qty 1

## 2019-04-27 MED ORDER — SODIUM CHLORIDE 0.9 % IV SOLN
INTRAVENOUS | Status: DC | PRN
  Administered 2019-04-27: 50 mL

## 2019-04-27 MED ORDER — KETOROLAC TROMETHAMINE 30 MG/ML IJ SOLN
30.0000 mg | Freq: Once | INTRAMUSCULAR | Status: DC | PRN
  Filled 2019-04-27: qty 1

## 2019-04-27 MED ORDER — SCOPOLAMINE 1 MG/3DAYS TD PT72
MEDICATED_PATCH | TRANSDERMAL | Status: AC
  Filled 2019-04-27: qty 1

## 2019-04-27 MED ORDER — HYDROMORPHONE HCL 1 MG/ML IJ SOLN
0.2500 mg | INTRAMUSCULAR | Status: DC | PRN
  Administered 2019-04-27: 10:00:00 0.5 mg via INTRAVENOUS
  Filled 2019-04-27: qty 0.5

## 2019-04-27 MED ORDER — LACTATED RINGERS IV SOLN
INTRAVENOUS | Status: DC
  Filled 2019-04-27: qty 1000

## 2019-04-27 MED ORDER — OXYCODONE HCL 5 MG PO TABS
5.0000 mg | ORAL_TABLET | Freq: Once | ORAL | Status: DC | PRN
  Filled 2019-04-27: qty 1

## 2019-04-27 MED ORDER — LIDOCAINE 2% (20 MG/ML) 5 ML SYRINGE
INTRAMUSCULAR | Status: AC
  Filled 2019-04-27: qty 15

## 2019-04-27 MED ORDER — SUGAMMADEX SODIUM 200 MG/2ML IV SOLN
INTRAVENOUS | Status: DC | PRN
  Administered 2019-04-27: 400 mg via INTRAVENOUS

## 2019-04-27 MED ORDER — LIDOCAINE 2% (20 MG/ML) 5 ML SYRINGE
INTRAMUSCULAR | Status: AC
  Filled 2019-04-27: qty 5

## 2019-04-27 MED ORDER — PROPOFOL 10 MG/ML IV BOLUS
INTRAVENOUS | Status: AC
  Filled 2019-04-27: qty 40

## 2019-04-27 MED ORDER — ROCURONIUM BROMIDE 10 MG/ML (PF) SYRINGE
PREFILLED_SYRINGE | INTRAVENOUS | Status: DC | PRN
  Administered 2019-04-27: 60 mg via INTRAVENOUS
  Administered 2019-04-27: 10 mg via INTRAVENOUS

## 2019-04-27 MED ORDER — OXYCODONE-ACETAMINOPHEN 5-325 MG PO TABS: 1.0000 | ORAL_TABLET | ORAL | 0 refills | Status: DC | PRN

## 2019-04-27 MED ORDER — MEPERIDINE HCL 25 MG/ML IJ SOLN
6.2500 mg | INTRAMUSCULAR | Status: DC | PRN
  Filled 2019-04-27: qty 1

## 2019-04-27 MED ORDER — ONDANSETRON HCL 4 MG/2ML IJ SOLN
INTRAMUSCULAR | Status: DC | PRN
  Administered 2019-04-27 (×2): 4 mg via INTRAVENOUS

## 2019-04-27 MED ORDER — OXYCODONE HCL 5 MG/5ML PO SOLN
5.0000 mg | Freq: Once | ORAL | Status: DC | PRN
  Filled 2019-04-27: qty 5

## 2019-04-27 MED ORDER — DEXAMETHASONE SODIUM PHOSPHATE 10 MG/ML IJ SOLN
INTRAMUSCULAR | Status: DC | PRN
  Administered 2019-04-27: 8 mg via INTRAVENOUS

## 2019-04-27 MED ORDER — KETAMINE HCL 10 MG/ML IJ SOLN
INTRAMUSCULAR | Status: AC
  Filled 2019-04-27: qty 1

## 2019-04-27 MED ORDER — ACETAMINOPHEN 500 MG PO TABS
1000.0000 mg | ORAL_TABLET | Freq: Once | ORAL | Status: AC
  Administered 2019-04-27: 06:00:00 1000 mg via ORAL
  Filled 2019-04-27: qty 2

## 2019-04-27 MED ORDER — GABAPENTIN 300 MG PO CAPS
ORAL_CAPSULE | ORAL | Status: AC
  Filled 2019-04-27: qty 1

## 2019-04-27 MED ORDER — LIDOCAINE 2% (20 MG/ML) 5 ML SYRINGE
INTRAMUSCULAR | Status: DC | PRN
  Administered 2019-04-27: 1.5 mg/kg/h via INTRAVENOUS

## 2019-04-27 MED ORDER — ACETAMINOPHEN 500 MG PO TABS
ORAL_TABLET | ORAL | Status: AC
  Filled 2019-04-27: qty 2

## 2019-04-27 SURGICAL SUPPLY — 61 items
BAG LAPAROSCOPIC 12 15 PORT 16 (BASKET) ×1 IMPLANT
BAG RETRIEVAL 12/15 (BASKET) ×2
BAG RETRIEVAL 12/15MM (BASKET) ×1
BARRIER ADHS 3X4 INTERCEED (GAUZE/BANDAGES/DRESSINGS) IMPLANT
CANISTER SUCT 3000ML PPV (MISCELLANEOUS) ×3 IMPLANT
COVER BACK TABLE 60X90IN (DRAPES) ×3 IMPLANT
COVER TIP SHEARS 8 DVNC (MISCELLANEOUS) ×1 IMPLANT
COVER TIP SHEARS 8MM DA VINCI (MISCELLANEOUS) ×2
DECANTER SPIKE VIAL GLASS SM (MISCELLANEOUS) ×3 IMPLANT
DEFOGGER SCOPE WARMER CLEARIFY (MISCELLANEOUS) ×3 IMPLANT
DERMABOND ADVANCED (GAUZE/BANDAGES/DRESSINGS) ×2
DERMABOND ADVANCED .7 DNX12 (GAUZE/BANDAGES/DRESSINGS) ×1 IMPLANT
DRAPE ARM DVNC X/XI (DISPOSABLE) ×4 IMPLANT
DRAPE COLUMN DVNC XI (DISPOSABLE) ×1 IMPLANT
DRAPE DA VINCI XI ARM (DISPOSABLE) ×8
DRAPE DA VINCI XI COLUMN (DISPOSABLE) ×2
DURAPREP 26ML APPLICATOR (WOUND CARE) ×3 IMPLANT
ELECT REM PT RETURN 9FT ADLT (ELECTROSURGICAL) ×3
ELECTRODE REM PT RTRN 9FT ADLT (ELECTROSURGICAL) ×1 IMPLANT
GLOVE BIO SURGEON STRL SZ7 (GLOVE) ×9 IMPLANT
GLOVE BIOGEL PI IND STRL 7.0 (GLOVE) ×2 IMPLANT
GLOVE BIOGEL PI INDICATOR 7.0 (GLOVE) ×4
IRRIG SUCT STRYKERFLOW 2 WTIP (MISCELLANEOUS) ×3
IRRIGATION SUCT STRKRFLW 2 WTP (MISCELLANEOUS) ×1 IMPLANT
IV NS IRRIG 3000ML ARTHROMATIC (IV SOLUTION) ×3 IMPLANT
LEGGING LITHOTOMY PAIR STRL (DRAPES) ×3 IMPLANT
MANIPULATOR ADVINCU DEL 2.5 PL (MISCELLANEOUS) IMPLANT
MANIPULATOR ADVINCU DEL 3.0 PL (MISCELLANEOUS) IMPLANT
MANIPULATOR ADVINCU DEL 3.5 PL (MISCELLANEOUS) IMPLANT
MANIPULATOR ADVINCU DEL 4.0 PL (MISCELLANEOUS) IMPLANT
NEEDLE INSUFFLATION 120MM (ENDOMECHANICALS) ×3 IMPLANT
NS IRRIG 500ML POUR BTL (IV SOLUTION) ×3 IMPLANT
OBTURATOR OPTICAL STANDARD 8MM (TROCAR) ×2
OBTURATOR OPTICAL STND 8 DVNC (TROCAR) ×1
OBTURATOR OPTICALSTD 8 DVNC (TROCAR) ×1 IMPLANT
PACK ROBOT WH (CUSTOM PROCEDURE TRAY) ×3 IMPLANT
PACK ROBOTIC GOWN (GOWN DISPOSABLE) ×3 IMPLANT
PACK TRENDGUARD 450 HYBRID PRO (MISCELLANEOUS) ×1 IMPLANT
PAD PREP 24X48 CUFFED NSTRL (MISCELLANEOUS) ×3 IMPLANT
POUCH LAPAROSCOPIC INSTRUMENT (MISCELLANEOUS) ×3 IMPLANT
POUCH SPECIMEN RETRIEVAL 10MM (ENDOMECHANICALS) ×3 IMPLANT
PROTECTOR NERVE ULNAR (MISCELLANEOUS) ×6 IMPLANT
SEAL CANN UNIV 5-8 DVNC XI (MISCELLANEOUS) ×3 IMPLANT
SEAL XI 5MM-8MM UNIVERSAL (MISCELLANEOUS) ×6
SET CYSTO W/LG BORE CLAMP LF (SET/KITS/TRAYS/PACK) ×3 IMPLANT
SET TRI-LUMEN FLTR TB AIRSEAL (TUBING) ×3 IMPLANT
SUT VIC AB 0 CT1 36 (SUTURE) ×3 IMPLANT
SUT VIC AB 2-0 UR6 27 (SUTURE) ×3 IMPLANT
SUT VICRYL RAPIDE 3 0 (SUTURE) ×6 IMPLANT
SUT VLOC 180 0 9IN  GS21 (SUTURE)
SUT VLOC 180 0 9IN GS21 (SUTURE) IMPLANT
SYS RETRIEVAL 5MM INZII UNIV (BASKET) ×3
SYSTEM RETRIEVL 5MM INZII UNIV (BASKET) ×1 IMPLANT
TOWEL OR 17X26 10 PK STRL BLUE (TOWEL DISPOSABLE) ×3 IMPLANT
TRAY FOLEY W/BAG SLVR 14FR (SET/KITS/TRAYS/PACK) ×3 IMPLANT
TRENDGUARD 450 HYBRID PRO PACK (MISCELLANEOUS) ×3
TROCAR BLADELESS OPT 12M 100M (ENDOMECHANICALS) ×3 IMPLANT
TROCAR BLADELESS OPT 5 100 (ENDOMECHANICALS) IMPLANT
TROCAR PORT AIRSEAL 5X120 (TROCAR) ×3 IMPLANT
TROCAR XCEL NON-BLD 11X100MML (ENDOMECHANICALS) ×3 IMPLANT
WATER STERILE IRR 1000ML POUR (IV SOLUTION) IMPLANT

## 2019-04-27 NOTE — Discharge Instructions (Signed)
Post Anesthesia Home Care Instructions  Activity: Get plenty of rest for the remainder of the day. A responsible individual must stay with you for 24 hours following the procedure.  For the next 24 hours, DO NOT: -Drive a car -Paediatric nurse -Drink alcoholic beverages -Take any medication unless instructed by your physician -Make any legal decisions or sign important papers.  Meals: Start with liquid foods such as gelatin or soup. Progress to regular foods as tolerated. Avoid greasy, spicy, heavy foods. If nausea and/or vomiting occur, drink only clear liquids until the nausea and/or vomiting subsides. Call your physician if vomiting continues.  Special Instructions/Symptoms: Your throat may feel dry or sore from the anesthesia or the breathing tube placed in your throat during surgery. If this causes discomfort, gargle with warm salt water. The discomfort should disappear within 24 hours.  If you had a scopolamine patch placed behind your ear for the management of post- operative nausea and/or vomiting:  1. The medication in the patch is effective for 72 hours, after which it should be removed.  Wrap patch in a tissue and discard in the trash. Wash hands thoroughly with soap and water. 2. You may remove the patch earlier than 72 hours if you experience unpleasant side effects which may include dry mouth, dizziness or visual disturbances. 3. Avoid touching the patch. Wash your hands with soap and water after contact with the patch.     Bilateral Salpingo-Oophorectomy, Care After This sheet gives you information about how to care for yourself after your procedure. Your health care provider may also give you more specific instructions. If you have problems or questions, contact your health care provider. What can I expect after the procedure? After the procedure, it is common to have:  Abdominal pain.  Some occasional vaginal bleeding (spotting).  Tiredness.  Symptoms of  menopause, such as hot flashes, night sweats, or mood swings. Follow these instructions at home: Incision care   Keep your incision area and your bandage (dressing) clean and dry.  Follow instructions from your health care provider about how to take care of your incision. Make sure you: ? Wash your hands with soap and water before you change your dressing. If soap and water are not available, use hand sanitizer. ? Change your dressing as told by your health care provider. ? Leave stitches (sutures), staples, skin glue, or adhesive strips in place. These skin closures may need to stay in place for 2 weeks or longer. If adhesive strip edges start to loosen and curl up, you may trim the loose edges. Do not remove adhesive strips completely unless your health care provider tells you to do that.  Check your incision area every day for signs of infection. Check for: ? Redness, swelling, or pain. ? Fluid or blood. ? Warmth. ? Pus or a bad smell. Activity   Do not drive or use heavy machinery while taking prescription pain medicine.  Do not drive for 24 hours if you received a medicine to help you relax (sedative) during your procedure.  Take frequent, short walks throughout the day. Rest when you get tired. Ask your health care provider what activities are safe for you.  Avoid activity that requires great effort. Also, avoid heavy lifting. Do not lift anything that is heavier than 10 lbs. (4.5 kg), or the limit that your health care provider tells you, until he or she says that it is safe to do so.  Do not douche, use tampons, or have sex  until your health care provider approves. General instructions   To prevent or treat constipation while you are taking prescription pain medicine, your health care provider may recommend that you: ? Drink enough fluid to keep your urine clear or pale yellow. ? Take over-the-counter or prescription medicines. ? Eat foods that are high in fiber, such as  fresh fruits and vegetables, whole grains, and beans. ? Limit foods that are high in fat and processed sugars, such as fried and sweet foods.  Take over-the-counter and prescription medicines only as told by your health care provider.  Do not take baths, swim, or use a hot tub until your health care provider approves. Ask your health care provider if you can take showers. You may only be allowed to take sponge baths for bathing.  Wear compression stockings as told by your health care provider. These stockings help to prevent blood clots and reduce swelling in your legs.  Keep all follow-up visits as told by your health care provider. This is important. Contact a health care provider if:  You have pain when you urinate.  You have pus or a bad smelling discharge coming from your vagina.  You have redness, swelling, or pain around your incision.  You have fluid or blood coming from your incision.  Your incision feels warm to the touch.  You have pus or a bad smell coming from your incision.  You have a fever.  Your incision starts to break open.  You have pain in the abdomen, and it gets worse or does not get better when you take medicine.  You develop a rash.  You develop nausea and vomiting.  You feel lightheaded. Get help right away if:  You develop pain in your chest or leg.  You become short of breath.  You faint.  You have increased bleeding from your vagina. Summary  After the procedure, it is common to have pain, bleeding in the vagina, and symptoms of menopause.  Follow instructions from your health care provider about how to take care of your incision.  Follow instructions from your health care provider about activities and restrictions.  Check your incision every day for signs of infection and report any symptoms to your health care provider. This information is not intended to replace advice given to you by your health care provider. Make sure you discuss  any questions you have with your health care provider. Document Released: 06/03/2005 Document Revised: 08/07/2018 Document Reviewed: 07/08/2016 Elsevier Patient Education  2020 [employer].

## 2019-04-27 NOTE — Op Note (Signed)
04/27/2019  9:10 AM  PATIENT:  Teresa [name]  38 y.o. female  PRE-OPERATIVE DIAGNOSIS:  ovarian cyst  POST-OPERATIVE DIAGNOSIS:  ovarian cyst  PROCEDURE:  Procedure(s) with comments: XI ROBOTIC ASSISTED RIGHT SALPINGO OOPHORECTOMY AND LEFT SALPINGECTOMY (N/A) - left salpinoopherectomy right salpingectomy  SURGEON:  Surgeon(s) and Role:    Bobbye Charleston, MD - Primary  ASSISTANTS: RFNA   ANESTHESIA:   general  EBL:  50cc   LOCAL MEDICATIONS USED:  OTHER Ropivicaine to pelvis  SPECIMEN:  Source of Specimen:  Right ovary and tube, Left tube  DISPOSITION OF SPECIMEN:  PATHOLOGY  COUNTS:  YES  TOURNIQUET:  * No tourniquets in log *  DICTATION: .Note written in EPIC  PLAN OF CARE: Discharge to home after PACU  PATIENT DISPOSITION:  PACU - hemodynamically stable.   Delay start of Pharmacological VTE agent (>24hrs) due to surgical blood loss or risk of bleeding: not applicable  Complications:  None.  Findings:  Normal L ovary- instead there was a 12 cm R Ovary almost torsed and removed intact outside the peritoneal cavity.  Some adhesions of omentum to anterior abdominal wall and the anterior uterus and R round ligament.  Liver edge and cul de sac were otherwise normal.  The ureters were seen well out of all fields of dissection.  Meds: Interceed  Technique:  After adequate general anesthesia was achieved, the patient was prepped and draped in sterile fashion.  The speculum was placed into the vagina and the uterine manipulator placed in the cervix. The speculum was removed and a catheter placed to drain the bladder during the surgery.  Attention was turned to the abdomen and a  2 cm incision was made above the umbilicus.  The veress needle passed into the abdomen without aspiration of bowel contents or blood.  The 8.5 mm trocar for the camera port was introduced after insuflatation and the above findings noted- namely that the cyst was larger and was on the R ovary  and not the L.  Two 8.5 mm trocars were introduced 10 cm either side of the camera port under direct visualization and the third airseal trocar introduced above that plane.  The bipolar forceps were introduced on arm 1 and the Hot Shears on arm 3.      The adhesions of the omentum were removed from the uterus and anterior abdominal with cautery. The IP was isolated by incising the peritoneum above and parallel to it with the hot shears.  The IP was then cauterized in two places and incised with the scissors.  The ureter was seen well below th field of dissection.  The R ovary was then removed from the uterine ovarian ligament with cautery of the bipolar.  The ovary was then placed in the cul de sac. Both tubes were then removed with cautery and hot shears and removed from the abdomen. Hemostasis was assured with the insufflation down.  The Robot was then undocked.  The 12 mm scope replaced the scope trocar and was introduced through the umbilical incision under direct visualization.  The ovary was placed inside an endobag.  The bag was then brought through the umbilical incision and opened to the outside.  The ovary was decompressed with a scalped and fluid suctioned.  Part of the ovary was removed in pieces and the second cyst decompressed in same way.  Fluid, ovary and tube were all successfully removed from the abdomen without any intraperitoneal spill and sent to pathology.  All instruments were removed and the abdomen desuflated.  The 12 mm trocar site fascia was closed with a figure of eight stitch of 2-Vicryl.  All skin incisions were closed with subcuticular stitches and Dermabond.  All instruments were withdrawn from the vagina.  Pt tolerated the procedure well and was returned to the recovery room in stable condition.    Hodges Treiber A

## 2019-04-27 NOTE — Brief Op Note (Signed)
04/27/2019  9:10 AM  PATIENT:  Ermelinda Das  38 y.o. female  PRE-OPERATIVE DIAGNOSIS:  ovarian cyst  POST-OPERATIVE DIAGNOSIS:  ovarian cyst  PROCEDURE:  Procedure(s) with comments: XI ROBOTIC ASSISTED RIGHT SALPINGO OOPHORECTOMY AND LEFT SALPINGECTOMY (N/A) - left salpinoopherectomy right salpingectomy  SURGEON:  Surgeon(s) and Role:    Bobbye Charleston, MD - Primary  ASSISTANTS: RFNA   ANESTHESIA:   general  EBL:  50cc   LOCAL MEDICATIONS USED:  OTHER Ropivicaine to pelvis  SPECIMEN:  Source of Specimen:  Right ovary and tube, Left tube  DISPOSITION OF SPECIMEN:  PATHOLOGY  COUNTS:  YES  TOURNIQUET:  * No tourniquets in log *  DICTATION: .Note written in EPIC  PLAN OF CARE: Discharge to home after PACU  PATIENT DISPOSITION:  PACU - hemodynamically stable.   Delay start of Pharmacological VTE agent (>24hrs) due to surgical blood loss or risk of bleeding: not applicable

## 2019-04-27 NOTE — Transfer of Care (Signed)
Immediate Anesthesia Transfer of Care Note  Patient: Teresa [name]  Procedure(s) Performed: Procedure(s) (LRB): XI ROBOTIC ASSISTED RIGHT SALPINGO OOPHORECTOMY AND LEFT SALPINGECTOMY (N/A)  Patient Location: PACU  Anesthesia Type: General  Level of Consciousness: awake, alert  and oriented  Airway & Oxygen Therapy: Patient Spontanous Breathing and Patient connected to nasal cannula oxygen  Post-op Assessment: Report given to PACU RN and Post -op Vital signs reviewed and stable  Post vital signs: Reviewed and stable  Complications: No apparent anesthesia complicationsLast Vitals:  Vitals Value Taken Time  BP 131/65 04/27/19 0930  Temp 36.5 C 04/27/19 0930  Pulse 103 04/27/19 0944  Resp 12 04/27/19 0944  SpO2 99 % 04/27/19 0944  Vitals shown include unvalidated device data.  Last Pain:  Vitals:   04/27/19 0930  TempSrc:   PainSc: 7       Patients Stated Pain Goal: 4 ([id number])  Complications:

## 2019-04-27 NOTE — Anesthesia Postprocedure Evaluation (Signed)
Anesthesia Post Note  Patient: Teresa [name]  Procedure(s) Performed: XI ROBOTIC ASSISTED RIGHT SALPINGO OOPHORECTOMY AND LEFT SALPINGECTOMY (N/A Abdomen)     Patient location during evaluation: PACU Anesthesia Type: General Level of consciousness: awake and alert Pain management: pain level controlled Vital Signs Assessment: post-procedure vital signs reviewed and stable Respiratory status: spontaneous breathing, nonlabored ventilation, respiratory function stable and patient connected to nasal cannula oxygen Cardiovascular status: blood pressure returned to baseline and stable Postop Assessment: no apparent nausea or vomiting Anesthetic complications: no    Last Vitals:  Vitals:   04/27/19 1023 04/27/19 1116  BP:  126/88  Pulse: 84 80  Resp: 13 16  Temp:  36.4 C  SpO2: 95% 96%    Last Pain:  Vitals:   04/27/19 1116  TempSrc: Oral  PainSc: 7                  Barnet Glasgow

## 2019-04-27 NOTE — Progress Notes (Signed)
There has been no change in the patients history, status or exam since the history and physical.  Vitals:   04/26/19 1647 04/27/19 0617  BP:  (!) 142/87  Pulse:  96  Resp:  16  Temp:  98.6 F (37 C)  TempSrc:  Oral  SpO2:  97%  Weight: 114.8 kg 116.6 kg  Height: 5\' 7"  (1.702 m) 5\' 7"  (1.702 m)    Results for orders placed or performed during the hospital encounter of 04/27/19 (from the past 72 hour(s))  Pregnancy, urine POC     Status: None   Collection Time: 04/27/19  5:49 AM  Result Value Ref Range   Preg Test, Ur NEGATIVE NEGATIVE    Comment:        THE SENSITIVITY OF THIS METHODOLOGY IS >24 mIU/mL   CBC     Status: Abnormal   Collection Time: 04/27/19  5:54 AM  Result Value Ref Range   WBC 7.7 4.0 - 10.5 K/uL   RBC 3.79 (L) 3.87 - 5.11 MIL/uL   Hemoglobin 11.7 (L) 12.0 - 15.0 g/dL   HCT 36.1 36.0 - 46.0 %   MCV 95.3 80.0 - 100.0 fL   MCH 30.9 26.0 - 34.0 pg   MCHC 32.4 30.0 - 36.0 g/dL   RDW 12.7 11.5 - 15.5 %   Platelets 287 150 - 400 K/uL   nRBC 0.0 0.0 - 0.2 %    Comment: Performed at [facility], [place] [address] [place], [place] [address]   Pt Covid 19 pos on Oct 24 and assymptomatic for 10 days+.  Daria Pastures

## 2019-04-27 NOTE — Anesthesia Procedure Notes (Signed)
Procedure Name: Intubation Date/Time: 04/27/2019 7:35 AM Performed by: Mechele Claude, CRNA Pre-anesthesia Checklist: Patient identified, Emergency Drugs available, Suction available and Patient being monitored Patient Re-evaluated:Patient Re-evaluated prior to induction Oxygen Delivery Method: Circle system utilized Preoxygenation: Pre-oxygenation with 100% oxygen Induction Type: IV induction Ventilation: Mask ventilation without difficulty Laryngoscope Size: Mac and 3 Grade View: Grade I Tube type: Oral Tube size: 7.0 mm Number of attempts: 1 Airway Equipment and Method: Stylet and Oral airway Placement Confirmation: ETT inserted through vocal cords under direct vision,  positive ETCO2 and breath sounds checked- equal and bilateral Secured at: 21 cm Tube secured with: Tape Dental Injury: Teeth and Oropharynx as per pre-operative assessment

## 2019-04-28 ENCOUNTER — Encounter (HOSPITAL_BASED_OUTPATIENT_CLINIC_OR_DEPARTMENT_OTHER): Payer: Self-pay | Admitting: Obstetrics and Gynecology

## 2019-04-28 LAB — SURGICAL PATHOLOGY

## 2021-02-05 IMAGING — US US ABDOMEN LIMITED
1 series · 14 of 25 positions shown · non-contrast
Comparison: None.

CLINICAL DATA: Right upper quadrant pain

EXAM:
ULTRASOUND ABDOMEN LIMITED RIGHT UPPER QUADRANT

[Series 1: us abdomen limited · 14 of 42 slices shown]
[im 1/42]
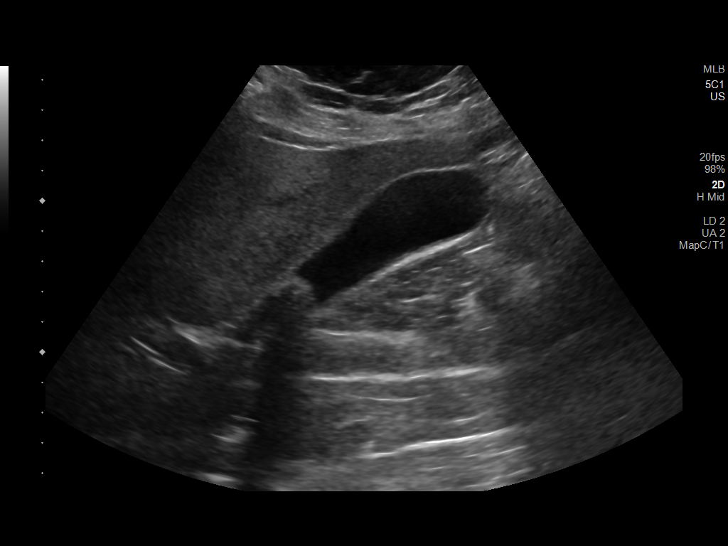
[im 4/42]
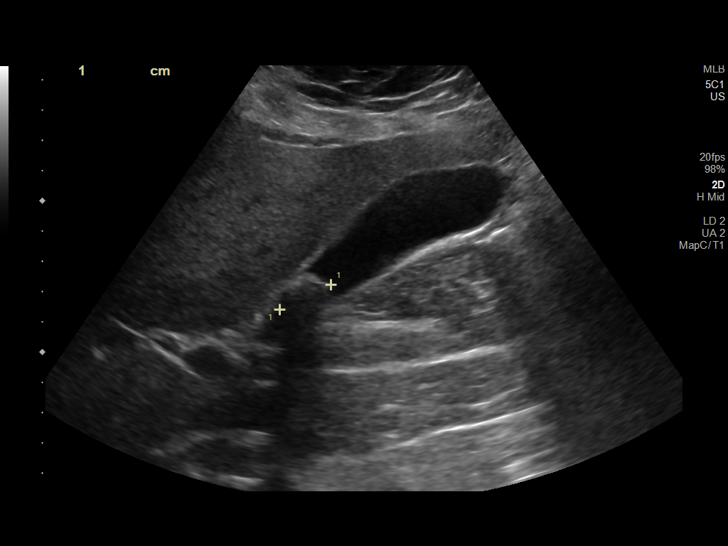
[im 7/42]
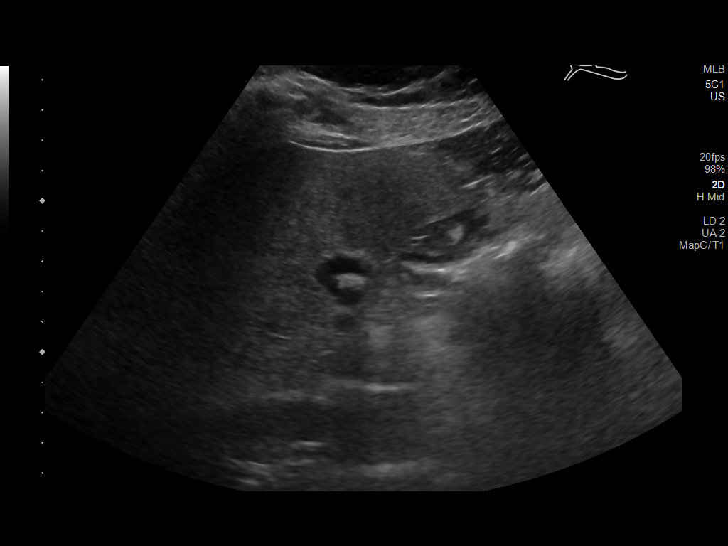
[im 11/42]
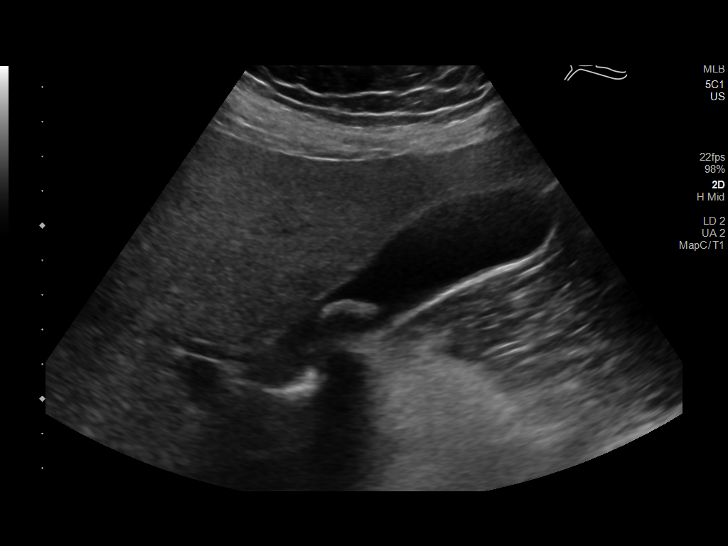
[im 14/42]
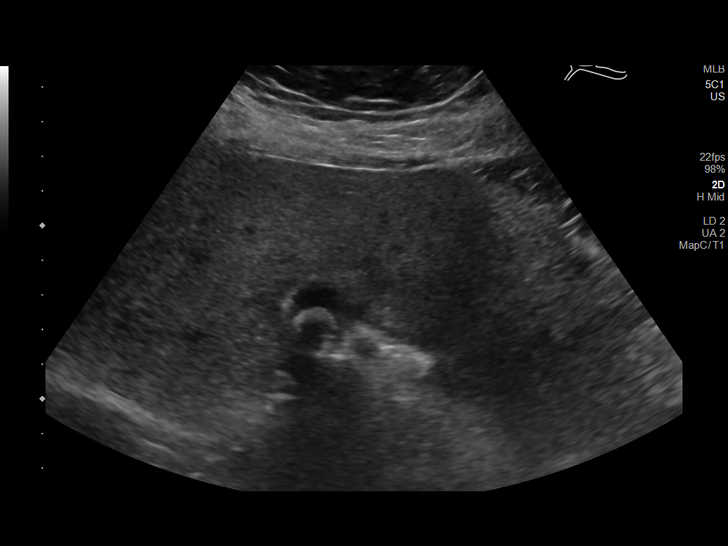
[im 16/42]
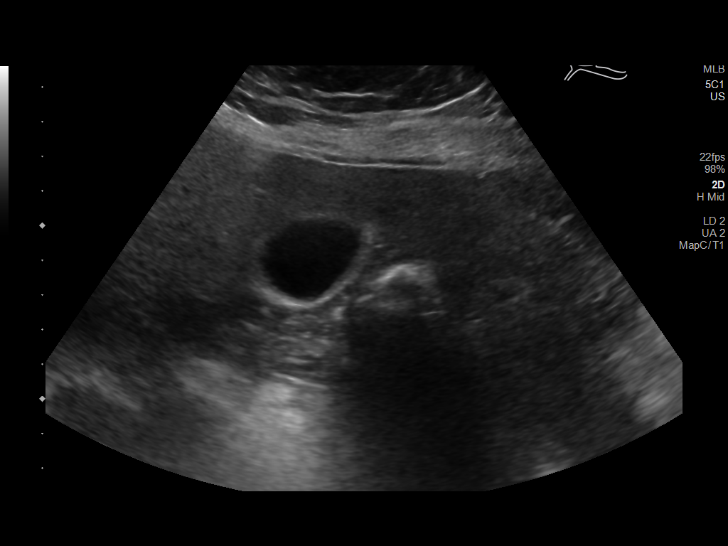
[im 19/42]
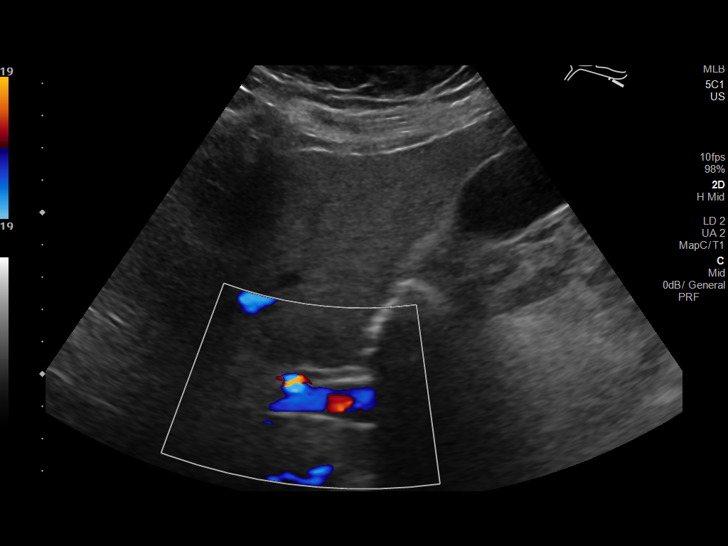
[im 23/42]
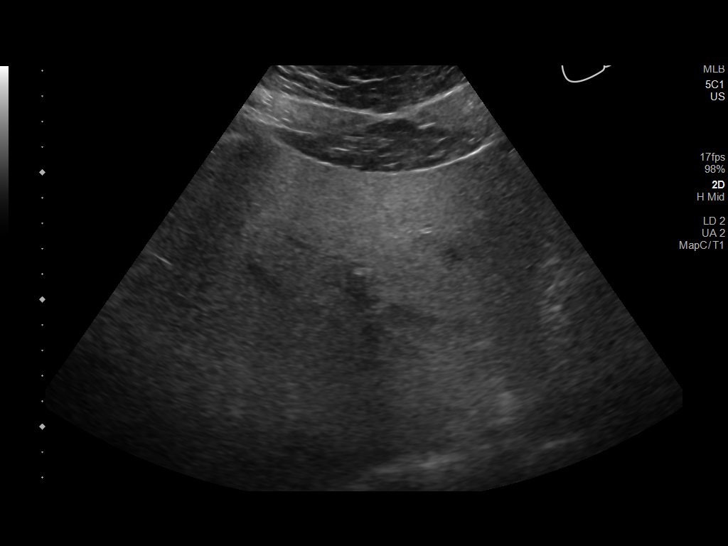
[im 26/42]
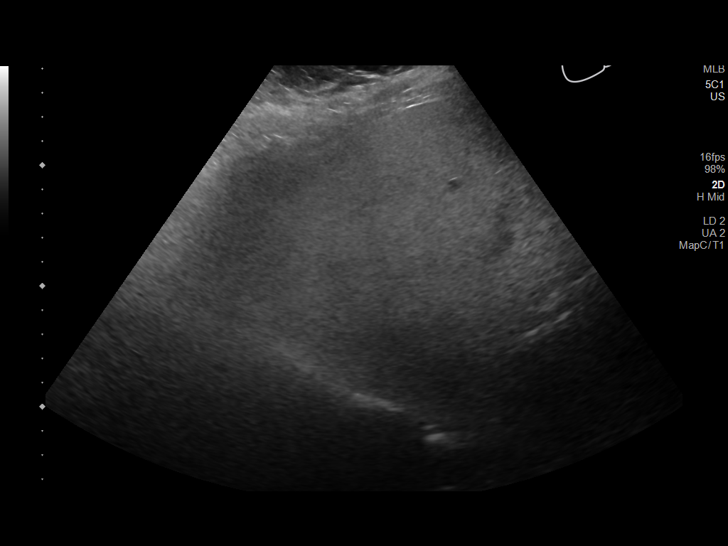
[im 28/42]
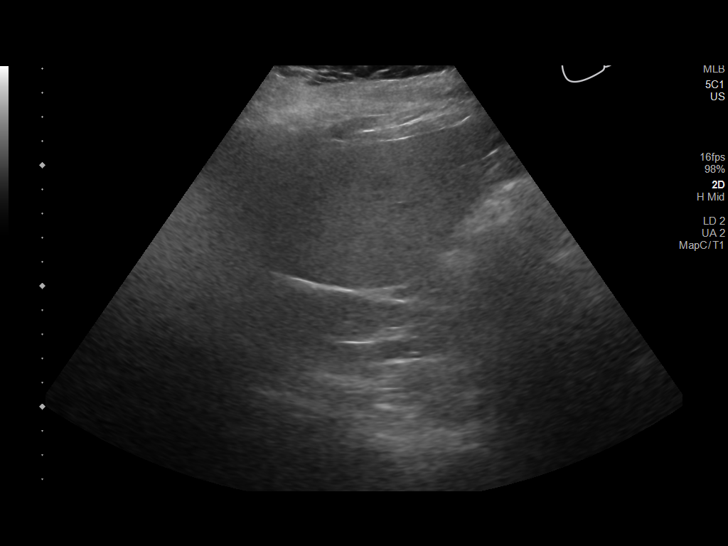
[im 31/42]
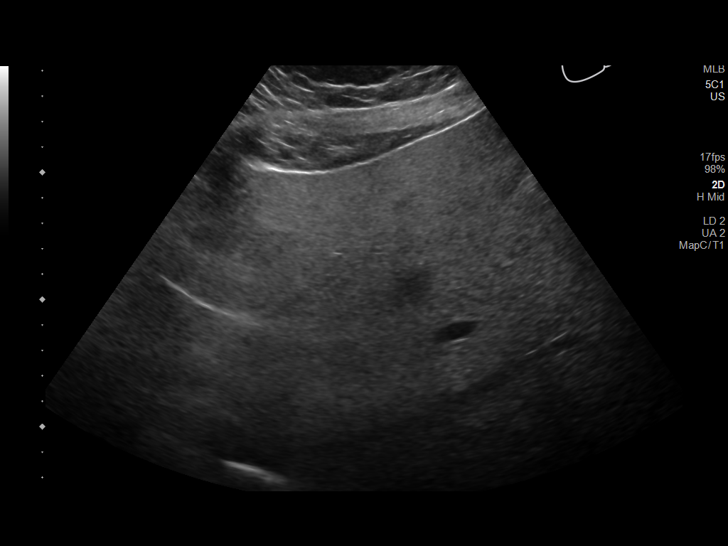
[im 35/42]
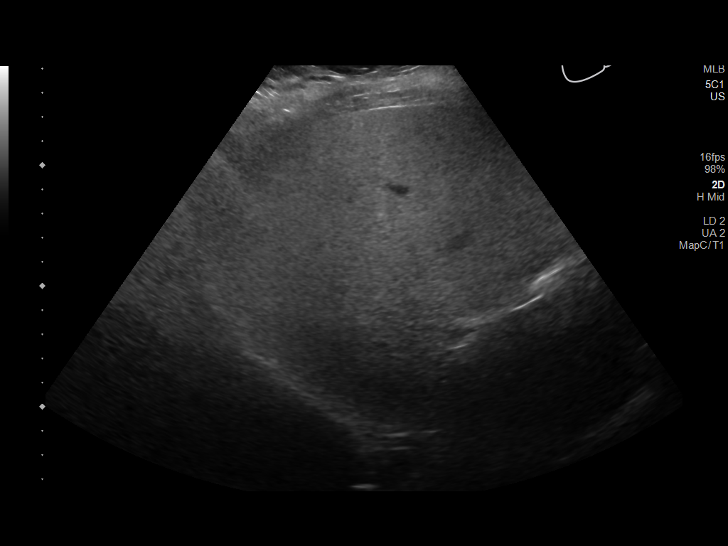
[im 38/42]
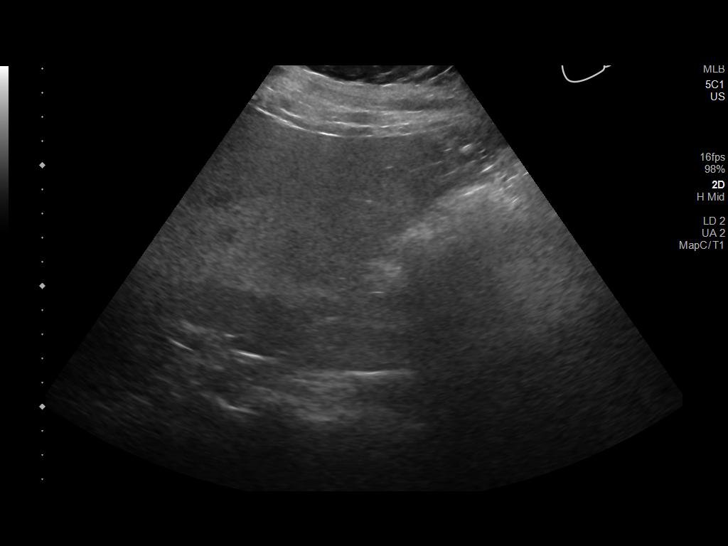
[im 42/42]
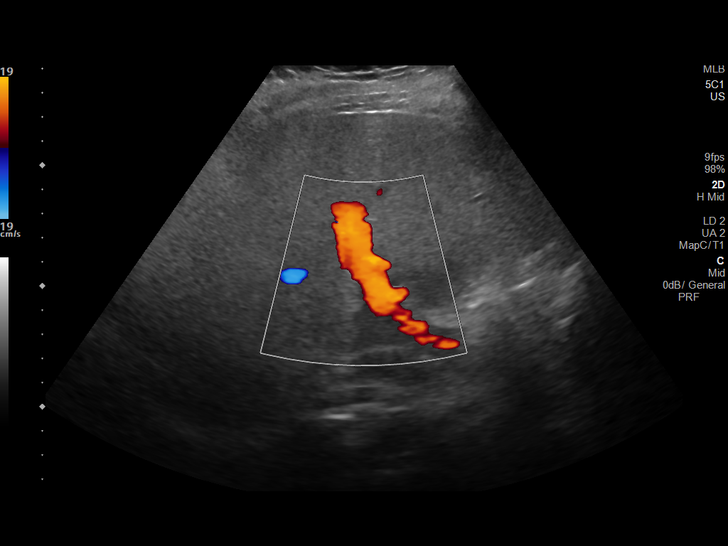

[14 of 25 positions shown; findings below may reference images not displayed]

FINDINGS: Gallbladder:

Echogenic shadowing gallstones noted measuring up to 1.9 cm. Wall
thickness normal measuring 1.2 mm. No Murphy's sign or
pericholecystic fluid. No signs of cholecystitis.

Common bile duct:

Diameter: 2.7 mm

Liver:

Mild increased echogenicity compatible with hepatic steatosis. No
focal abnormality or intrahepatic biliary dilatation.

Portal vein is patent on color Doppler imaging with normal direction
of blood flow towards the liver.

Other: No free fluid or ascites
IMPRESSION: Cholelithiasis without signs of cholecystitis.

Hepatic steatosis

## 2021-02-05 IMAGING — CT CT ABD-PELV W/ CM
2 of 3 series · 16 of 46 positions shown, 18 images · IV contrast (omnipaque)
Comparison: None

CLINICAL DATA: Abdominal pain since last night, fever

EXAM:
CT ABDOMEN AND PELVIS WITH CONTRAST
TECHNIQUE: Multidetector CT imaging of the abdomen and pelvis was performed
using the standard protocol following bolus administration of
intravenous contrast. Sagittal and coronal MPR images reconstructed
from axial data set.
CONTRAST:  100mL OMNIPAQUE IOHEXOL 300 MG/ML SOLN IV. No oral
contrast.

[Series 3: a/p w/ 5mm · axial · 0.98mm/px · z∈[+795,+1265]mm · 13 of 110 slices shown, 15 images]
[im 8/110  soft-tissue]
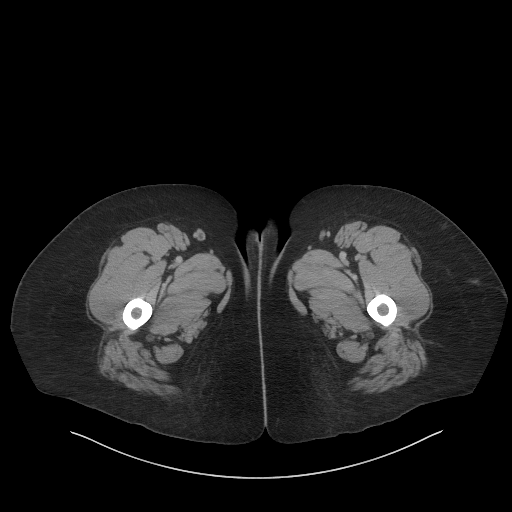
[im 8/110  bone]
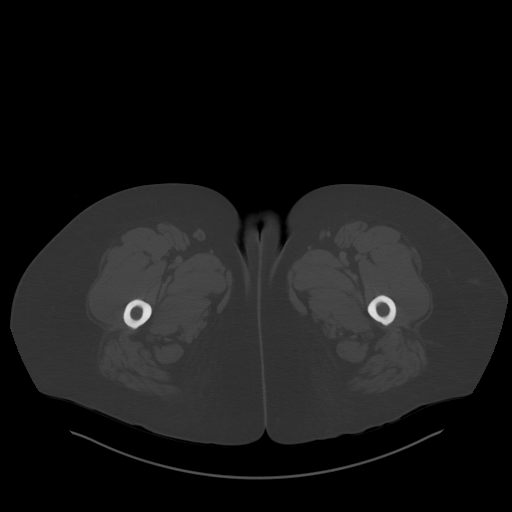
[im 15/110  soft-tissue]
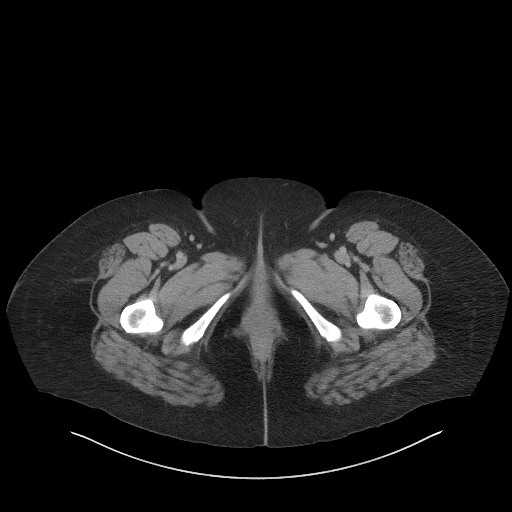
[im 22/110  soft-tissue]
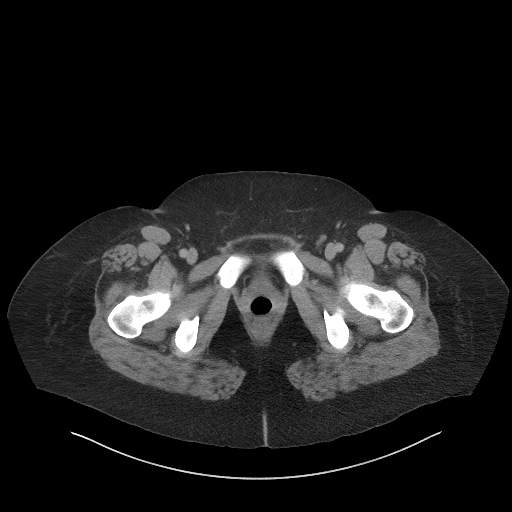
[im 32/110  soft-tissue]
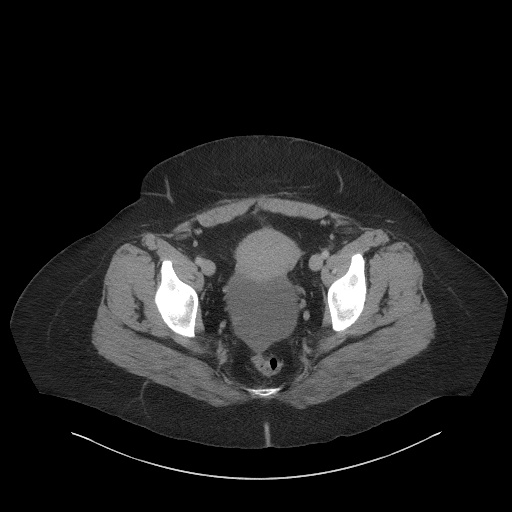
[im 39/110  soft-tissue]
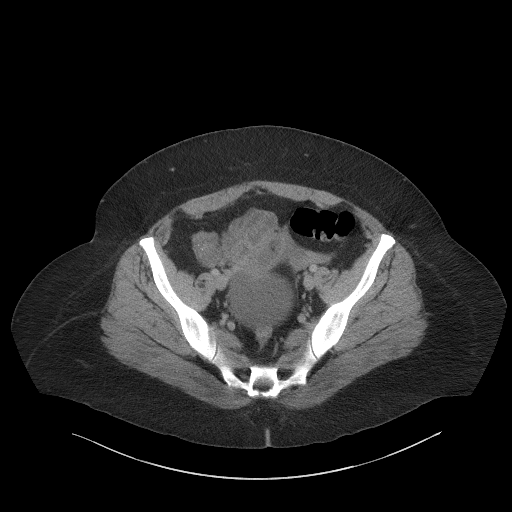
[im 46/110  soft-tissue]
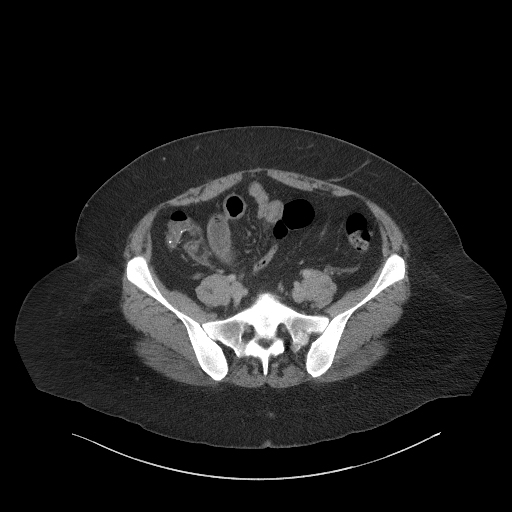
[im 57/110  soft-tissue]
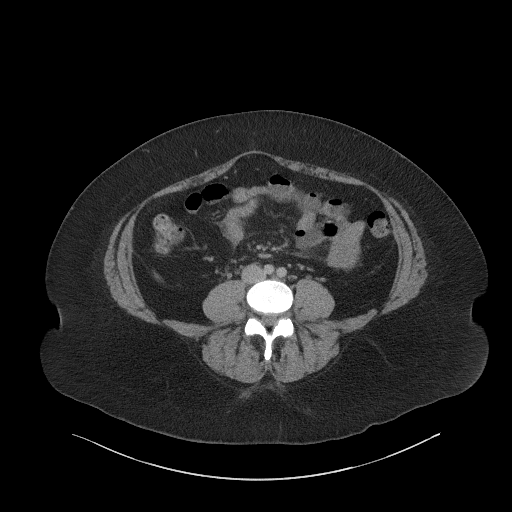
[im 64/110  soft-tissue]
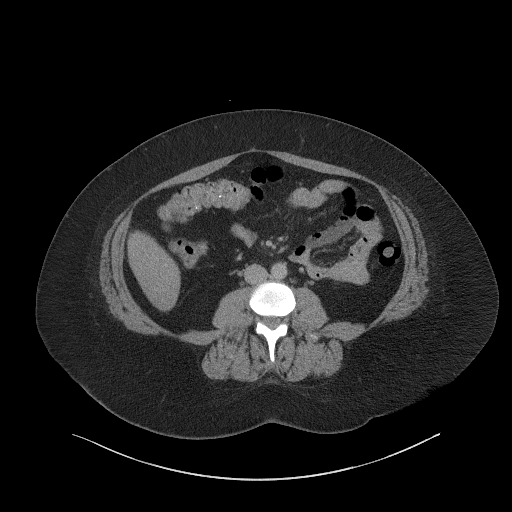
[im 71/110  soft-tissue]
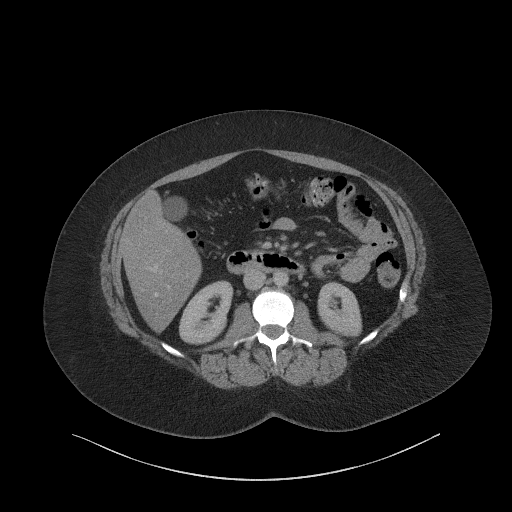
[im 71/110  bone]
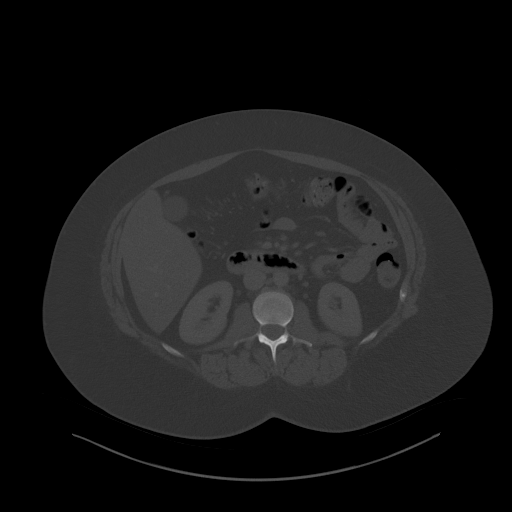
[im 78/110  soft-tissue]
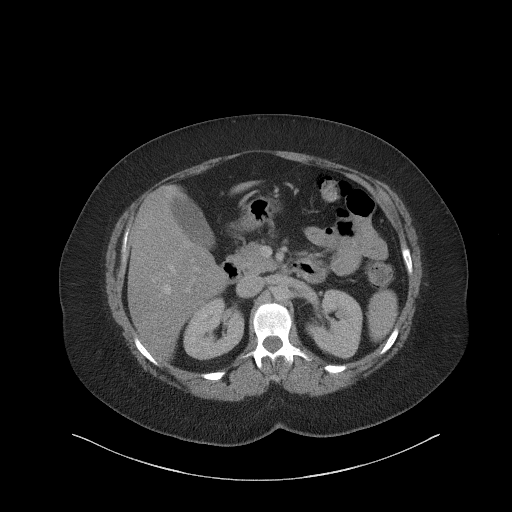
[im 88/110  soft-tissue]
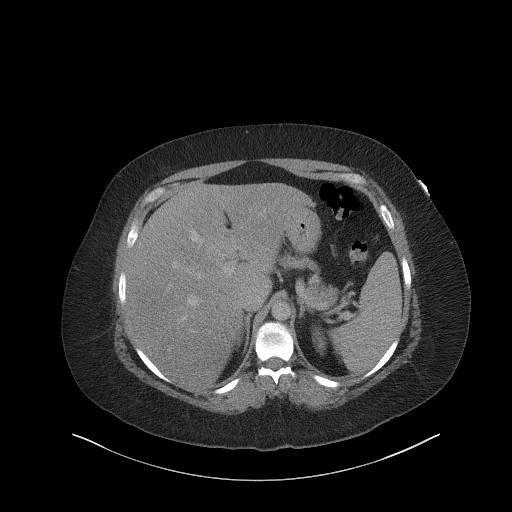
[im 95/110  soft-tissue]
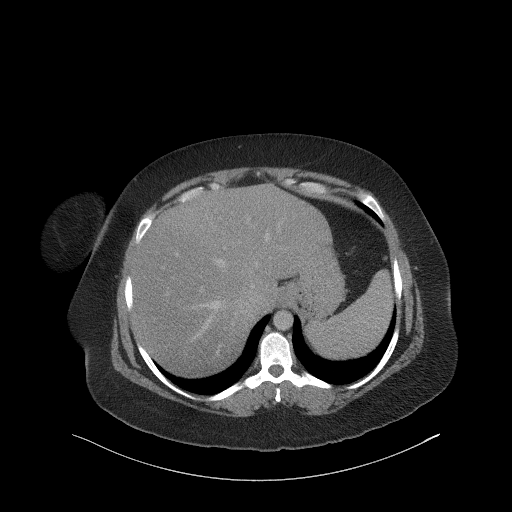
[im 102/110  soft-tissue]
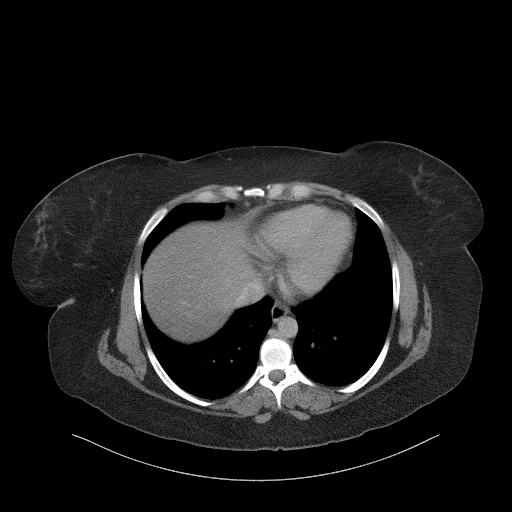

[Series 6: a/p w/ cor · coronal · 1.07mm/px · 3 of 193 slices shown]
[im 65/193  soft-tissue]
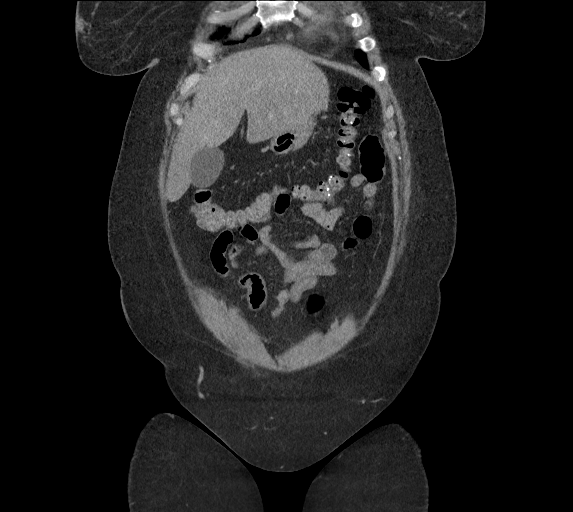
[im 86/193  soft-tissue]
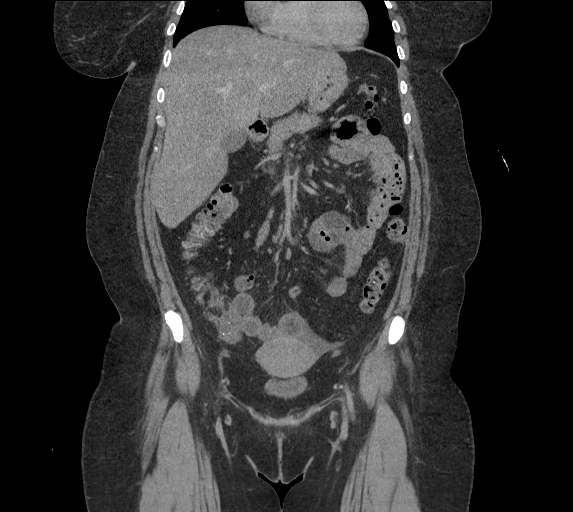
[im 107/193  soft-tissue]
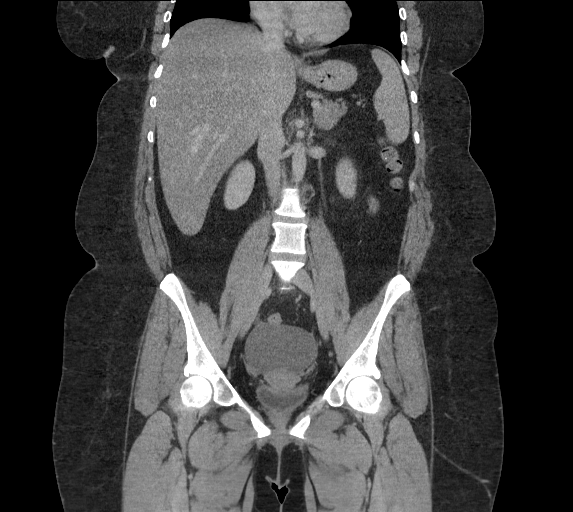

[16 of 46 positions shown; findings below may reference images not displayed]

FINDINGS: Lower chest: Minimal dependent atelectasis at lung bases

Hepatobiliary: Fatty infiltration of liver with focal fatty
infiltration adjacent to falciform fissure. Gallbladder and liver
otherwise unremarkable.

Pancreas: Normal appearance

Spleen: Normal appearance

Adrenals/Urinary Tract: Adrenal glands, kidneys, ureters, and
bladder normal appearance

Stomach/Bowel: Normal appendix, retrocecal. Scattered stippled
radiopacities within stool of the ascending and transverse colon
compatible with ingested radiopaque foreign bodies. Stomach
decompressed. Mild bowel wall thickening of a distal small bowel
loop in the pelvis. Remaining bowel loops unremarkable.

Vascular/Lymphatic: Aorta normal caliber.  No adenopathy.

Reproductive: Uterus unremarkable. Probable visualization of a
normal LEFT ovary. Large cystic structure within the cul-de-sac,
x 7.7 x 5.5 cm in size question of ovarian origin. Tampon in vagina.

Other: No free air or free fluid. No additional inflammatory process
or hernia.

Musculoskeletal: Unremarkable
IMPRESSION: Fatty infiltration of liver.

Large cyst within cul-de-sac 7.7 cm greatest size, suspect of
ovarian origin; pelvic and transvaginal ultrasound recommended for
further assessment.

Mild wall thickening of a distal small bowel loop in the pelvis
consistent with enteritis; differential diagnosis would include
infection and inflammatory bowel disease.

## 2022-12-17 ENCOUNTER — Encounter (HOSPITAL_BASED_OUTPATIENT_CLINIC_OR_DEPARTMENT_OTHER): Payer: Self-pay

## 2022-12-17 ENCOUNTER — Other Ambulatory Visit: Payer: Self-pay

## 2022-12-17 ENCOUNTER — Ambulatory Visit (HOSPITAL_BASED_OUTPATIENT_CLINIC_OR_DEPARTMENT_OTHER)
Admission: EM | Admit: 2022-12-17 | Discharge: 2022-12-18 | Disposition: A | Discharge status: Home / Self Care | Payer: BC Managed Care – PPO | Attending: General Surgery | Admitting: General Surgery

## 2022-12-17 ENCOUNTER — Emergency Department (HOSPITAL_BASED_OUTPATIENT_CLINIC_OR_DEPARTMENT_OTHER): Payer: BC Managed Care – PPO | Admitting: Radiology

## 2022-12-17 DIAGNOSIS — K828 Other specified diseases of gallbladder: Secondary | ICD-10-CM | POA: Insufficient documentation

## 2022-12-17 DIAGNOSIS — F32A Depression, unspecified: Secondary | ICD-10-CM | POA: Diagnosis not present

## 2022-12-17 DIAGNOSIS — K219 Gastro-esophageal reflux disease without esophagitis: Secondary | ICD-10-CM | POA: Insufficient documentation

## 2022-12-17 DIAGNOSIS — F419 Anxiety disorder, unspecified: Secondary | ICD-10-CM | POA: Diagnosis not present

## 2022-12-17 DIAGNOSIS — K760 Fatty (change of) liver, not elsewhere classified: Secondary | ICD-10-CM | POA: Diagnosis not present

## 2022-12-17 DIAGNOSIS — K8000 Calculus of gallbladder with acute cholecystitis without obstruction: Secondary | ICD-10-CM | POA: Diagnosis not present

## 2022-12-17 DIAGNOSIS — Z833 Family history of diabetes mellitus: Secondary | ICD-10-CM | POA: Insufficient documentation

## 2022-12-17 DIAGNOSIS — Z87891 Personal history of nicotine dependence: Secondary | ICD-10-CM | POA: Diagnosis not present

## 2022-12-17 DIAGNOSIS — K810 Acute cholecystitis: Secondary | ICD-10-CM | POA: Diagnosis present

## 2022-12-17 LAB — CBC
HCT: 39.9 % (ref 36.0–46.0)
Hemoglobin: 13.1 g/dL (ref 12.0–15.0)
MCH: 30.6 pg (ref 26.0–34.0)
MCHC: 32.8 g/dL (ref 30.0–36.0)
MCV: 93.2 fL (ref 80.0–100.0)
Platelets: 315 10*3/uL (ref 150–400)
RBC: 4.28 MIL/uL (ref 3.87–5.11)
RDW: 13.5 % (ref 11.5–15.5)
WBC: 9.7 10*3/uL (ref 4.0–10.5)
nRBC: 0 % (ref 0.0–0.2)

## 2022-12-17 LAB — BASIC METABOLIC PANEL
Anion gap: 12 (ref 5–15)
BUN: 14 mg/dL (ref 6–20)
CO2: 21 mmol/L — ABNORMAL LOW (ref 22–32)
Calcium: 9.4 mg/dL (ref 8.9–10.3)
Chloride: 103 mmol/L (ref 98–111)
Creatinine, Ser: 0.82 mg/dL (ref 0.44–1.00)
GFR, Estimated: >60 mL/min (ref >60)
Glucose, Bld: 92 mg/dL (ref 70–99)
Potassium: 4.1 mmol/L (ref 3.5–5.1)
Sodium: 136 mmol/L (ref 135–145)

## 2022-12-17 LAB — TROPONIN I (HIGH SENSITIVITY): Troponin I (High Sensitivity): <2 ng/L (ref <18)

## 2022-12-17 NOTE — ED Triage Notes (Signed)
Pt reports chest pressure that started around 6pm tonight along with feeling nauseous.   Pt report mid/upper sternal chest pressure in triage. A/0x4; GCS 15, no active distress in triage.

## 2022-12-18 ENCOUNTER — Emergency Department (HOSPITAL_BASED_OUTPATIENT_CLINIC_OR_DEPARTMENT_OTHER): Payer: BC Managed Care – PPO

## 2022-12-18 ENCOUNTER — Encounter (HOSPITAL_COMMUNITY)
Admission: EM | Disposition: A | Discharge status: Home / Self Care | Payer: Self-pay | Source: Home / Self Care | Attending: Emergency Medicine

## 2022-12-18 ENCOUNTER — Emergency Department (HOSPITAL_COMMUNITY): Payer: BC Managed Care – PPO | Admitting: Certified Registered"

## 2022-12-18 ENCOUNTER — Other Ambulatory Visit: Payer: Self-pay

## 2022-12-18 ENCOUNTER — Encounter (HOSPITAL_COMMUNITY): Payer: Self-pay

## 2022-12-18 DIAGNOSIS — K810 Acute cholecystitis: Secondary | ICD-10-CM | POA: Diagnosis present

## 2022-12-18 DIAGNOSIS — F32A Depression, unspecified: Secondary | ICD-10-CM | POA: Diagnosis not present

## 2022-12-18 DIAGNOSIS — Z833 Family history of diabetes mellitus: Secondary | ICD-10-CM | POA: Diagnosis not present

## 2022-12-18 DIAGNOSIS — K760 Fatty (change of) liver, not elsewhere classified: Secondary | ICD-10-CM | POA: Diagnosis not present

## 2022-12-18 DIAGNOSIS — Z87891 Personal history of nicotine dependence: Secondary | ICD-10-CM | POA: Diagnosis not present

## 2022-12-18 DIAGNOSIS — K8000 Calculus of gallbladder with acute cholecystitis without obstruction: Secondary | ICD-10-CM | POA: Diagnosis not present

## 2022-12-18 DIAGNOSIS — K828 Other specified diseases of gallbladder: Secondary | ICD-10-CM | POA: Diagnosis not present

## 2022-12-18 DIAGNOSIS — K219 Gastro-esophageal reflux disease without esophagitis: Secondary | ICD-10-CM | POA: Diagnosis not present

## 2022-12-18 DIAGNOSIS — F419 Anxiety disorder, unspecified: Secondary | ICD-10-CM | POA: Diagnosis not present

## 2022-12-18 HISTORY — PX: CHOLECYSTECTOMY: SHX55

## 2022-12-18 LAB — PREGNANCY, URINE: Preg Test, Ur: NEGATIVE

## 2022-12-18 LAB — HEPATIC FUNCTION PANEL
ALT: 15 U/L (ref 0–44)
AST: 13 U/L — ABNORMAL LOW (ref 15–41)
Albumin: 4.4 g/dL (ref 3.5–5.0)
Alkaline Phosphatase: 28 U/L — ABNORMAL LOW (ref 38–126)
Bilirubin, Direct: 0.1 mg/dL (ref 0.0–0.2)
Indirect Bilirubin: 0.3 mg/dL (ref 0.3–0.9)
Total Bilirubin: 0.4 mg/dL (ref 0.3–1.2)
Total Protein: 7.6 g/dL (ref 6.5–8.1)

## 2022-12-18 LAB — LIPASE, BLOOD: Lipase: 18 U/L (ref 11–51)

## 2022-12-18 LAB — TROPONIN I (HIGH SENSITIVITY): Troponin I (High Sensitivity): <2 ng/L (ref <18)

## 2022-12-18 SURGERY — LAPAROSCOPIC CHOLECYSTECTOMY
Anesthesia: General | Site: Abdomen

## 2022-12-18 MED ORDER — OXYCODONE HCL 5 MG/5ML PO SOLN: 5.0000 mg | Freq: Once | ORAL | Status: AC | PRN

## 2022-12-18 MED ORDER — LIDOCAINE 2% (20 MG/ML) 5 ML SYRINGE
INTRAMUSCULAR | Status: DC | PRN
  Administered 2022-12-18: 100 mg via INTRAVENOUS

## 2022-12-18 MED ORDER — DEXAMETHASONE SODIUM PHOSPHATE 10 MG/ML IJ SOLN
INTRAMUSCULAR | Status: DC | PRN
  Administered 2022-12-18: 10 mg via INTRAVENOUS

## 2022-12-18 MED ORDER — LACTATED RINGERS IV SOLN: INTRAVENOUS | Status: DC

## 2022-12-18 MED ORDER — FENTANYL CITRATE (PF) 100 MCG/2ML IJ SOLN
25.0000 ug | INTRAMUSCULAR | Status: DC | PRN
  Administered 2022-12-18 (×3): 25 ug via INTRAVENOUS

## 2022-12-18 MED ORDER — PROPOFOL 10 MG/ML IV BOLUS
INTRAVENOUS | Status: DC | PRN
  Administered 2022-12-18: 200 mg via INTRAVENOUS

## 2022-12-18 MED ORDER — ONDANSETRON HCL 4 MG/2ML IJ SOLN
INTRAMUSCULAR | Status: AC
  Filled 2022-12-18: qty 2

## 2022-12-18 MED ORDER — OXYCODONE HCL 5 MG PO TABS: 5.0000 mg | ORAL_TABLET | Freq: Four times a day (QID) | ORAL | 0 refills | Status: AC | PRN

## 2022-12-18 MED ORDER — ACETAMINOPHEN 10 MG/ML IV SOLN
INTRAVENOUS | Status: AC
  Filled 2022-12-18: qty 100

## 2022-12-18 MED ORDER — ACETAMINOPHEN 10 MG/ML IV SOLN: 1000.0000 mg | Freq: Once | INTRAVENOUS | Status: DC | PRN

## 2022-12-18 MED ORDER — SUGAMMADEX SODIUM 200 MG/2ML IV SOLN
INTRAVENOUS | Status: DC | PRN
  Administered 2022-12-18: 200 mg via INTRAVENOUS

## 2022-12-18 MED ORDER — DEXAMETHASONE SODIUM PHOSPHATE 10 MG/ML IJ SOLN
INTRAMUSCULAR | Status: AC
  Filled 2022-12-18: qty 1

## 2022-12-18 MED ORDER — HYDROMORPHONE HCL 1 MG/ML IJ SOLN
INTRAMUSCULAR | Status: DC | PRN
  Administered 2022-12-18: .5 mg via INTRAVENOUS

## 2022-12-18 MED ORDER — 0.9 % SODIUM CHLORIDE (POUR BTL) OPTIME
TOPICAL | Status: DC | PRN
  Administered 2022-12-18: 1000 mL

## 2022-12-18 MED ORDER — PROPOFOL 10 MG/ML IV BOLUS
INTRAVENOUS | Status: AC
  Filled 2022-12-18: qty 20

## 2022-12-18 MED ORDER — ROCURONIUM BROMIDE 10 MG/ML (PF) SYRINGE
PREFILLED_SYRINGE | INTRAVENOUS | Status: DC | PRN
  Administered 2022-12-18: 10 mg via INTRAVENOUS
  Administered 2022-12-18: 60 mg via INTRAVENOUS

## 2022-12-18 MED ORDER — ACETAMINOPHEN 10 MG/ML IV SOLN
INTRAVENOUS | Status: DC | PRN
  Administered 2022-12-18: 1000 mg via INTRAVENOUS

## 2022-12-18 MED ORDER — SODIUM CHLORIDE 0.9 % IV SOLN
2.0000 g | Freq: Once | INTRAVENOUS | Status: AC
  Administered 2022-12-18: 2 g via INTRAVENOUS
  Filled 2022-12-18: qty 20

## 2022-12-18 MED ORDER — FENTANYL CITRATE (PF) 100 MCG/2ML IJ SOLN
INTRAMUSCULAR | Status: AC
  Filled 2022-12-18: qty 2

## 2022-12-18 MED ORDER — SCOPOLAMINE 1 MG/3DAYS TD PT72
1.0000 | MEDICATED_PATCH | Freq: Once | TRANSDERMAL | Status: AC
  Administered 2022-12-18: 1 via TRANSDERMAL

## 2022-12-18 MED ORDER — AMISULPRIDE (ANTIEMETIC) 5 MG/2ML IV SOLN: 10.0000 mg | Freq: Once | INTRAVENOUS | Status: DC | PRN

## 2022-12-18 MED ORDER — SCOPOLAMINE 1 MG/3DAYS TD PT72
MEDICATED_PATCH | TRANSDERMAL | Status: AC
  Filled 2022-12-18: qty 1

## 2022-12-18 MED ORDER — BUPIVACAINE-EPINEPHRINE (PF) 0.25% -1:200000 IJ SOLN
INTRAMUSCULAR | Status: AC
  Filled 2022-12-18: qty 30

## 2022-12-18 MED ORDER — ACETAMINOPHEN 500 MG PO TABS: 1000.0000 mg | ORAL_TABLET | Freq: Three times a day (TID) | ORAL | 0 refills | Status: AC

## 2022-12-18 MED ORDER — CHLORHEXIDINE GLUCONATE 0.12 % MT SOLN
OROMUCOSAL | Status: AC
  Administered 2022-12-18: 15 mL via OROMUCOSAL
  Filled 2022-12-18: qty 15

## 2022-12-18 MED ORDER — OXYCODONE HCL 5 MG PO TABS
ORAL_TABLET | ORAL | Status: AC
  Filled 2022-12-18: qty 1

## 2022-12-18 MED ORDER — OXYCODONE HCL 5 MG PO TABS
5.0000 mg | ORAL_TABLET | Freq: Once | ORAL | Status: AC | PRN
  Administered 2022-12-18: 5 mg via ORAL

## 2022-12-18 MED ORDER — MORPHINE SULFATE (PF) 2 MG/ML IV SOLN
2.0000 mg | INTRAVENOUS | Status: DC | PRN
  Administered 2022-12-18: 2 mg via INTRAVENOUS
  Filled 2022-12-18: qty 1

## 2022-12-18 MED ORDER — HYDROMORPHONE HCL 1 MG/ML IJ SOLN
INTRAMUSCULAR | Status: AC
  Filled 2022-12-18: qty 0.5

## 2022-12-18 MED ORDER — FENTANYL CITRATE (PF) 250 MCG/5ML IJ SOLN
INTRAMUSCULAR | Status: AC
  Filled 2022-12-18: qty 5

## 2022-12-18 MED ORDER — KETOROLAC TROMETHAMINE 30 MG/ML IJ SOLN
INTRAMUSCULAR | Status: DC | PRN
  Administered 2022-12-18: 30 mg via INTRAVENOUS

## 2022-12-18 MED ORDER — CHLORHEXIDINE GLUCONATE 0.12 % MT SOLN: 15.0000 mL | Freq: Once | OROMUCOSAL | Status: AC

## 2022-12-18 MED ORDER — KETOROLAC TROMETHAMINE 30 MG/ML IJ SOLN
INTRAMUSCULAR | Status: AC
  Filled 2022-12-18: qty 1

## 2022-12-18 MED ORDER — INDOCYANINE GREEN 25 MG IV SOLR
INTRAVENOUS | Status: DC | PRN
  Administered 2022-12-18: 2.5 mg via INTRAVENOUS

## 2022-12-18 MED ORDER — ROCURONIUM BROMIDE 10 MG/ML (PF) SYRINGE
PREFILLED_SYRINGE | INTRAVENOUS | Status: AC
  Filled 2022-12-18: qty 10

## 2022-12-18 MED ORDER — LIDOCAINE 2% (20 MG/ML) 5 ML SYRINGE
INTRAMUSCULAR | Status: AC
  Filled 2022-12-18: qty 5

## 2022-12-18 MED ORDER — MIDAZOLAM HCL 2 MG/2ML IJ SOLN
INTRAMUSCULAR | Status: DC | PRN
  Administered 2022-12-18: 2 mg via INTRAVENOUS

## 2022-12-18 MED ORDER — ONDANSETRON HCL 4 MG/2ML IJ SOLN
INTRAMUSCULAR | Status: DC | PRN
  Administered 2022-12-18: 4 mg via INTRAVENOUS

## 2022-12-18 MED ORDER — FENTANYL CITRATE (PF) 250 MCG/5ML IJ SOLN
INTRAMUSCULAR | Status: DC | PRN
  Administered 2022-12-18: 50 ug via INTRAVENOUS
  Administered 2022-12-18: 100 ug via INTRAVENOUS
  Administered 2022-12-18 (×2): 50 ug via INTRAVENOUS

## 2022-12-18 MED ORDER — ORAL CARE MOUTH RINSE: 15.0000 mL | Freq: Once | OROMUCOSAL | Status: AC

## 2022-12-18 MED ORDER — PROPOFOL 1000 MG/100ML IV EMUL
INTRAVENOUS | Status: AC
  Filled 2022-12-18: qty 100

## 2022-12-18 MED ORDER — BUPIVACAINE-EPINEPHRINE 0.25% -1:200000 IJ SOLN
INTRAMUSCULAR | Status: DC | PRN
  Administered 2022-12-18: 23 mL

## 2022-12-18 MED ORDER — PROPOFOL 500 MG/50ML IV EMUL
INTRAVENOUS | Status: DC | PRN
  Administered 2022-12-18: 250 ug/kg/min via INTRAVENOUS
  Administered 2022-12-18: 100 ug/kg/min via INTRAVENOUS

## 2022-12-18 MED ORDER — MIDAZOLAM HCL 2 MG/2ML IJ SOLN
INTRAMUSCULAR | Status: AC
  Filled 2022-12-18: qty 2

## 2022-12-18 MED ORDER — PROCHLORPERAZINE EDISYLATE 10 MG/2ML IJ SOLN: 10.0000 mg | Freq: Once | INTRAMUSCULAR | Status: DC

## 2022-12-18 MED ORDER — SODIUM CHLORIDE 0.9 % IR SOLN
Status: DC | PRN
  Administered 2022-12-18: 1000 mL

## 2022-12-18 MED ORDER — MORPHINE SULFATE (PF) 4 MG/ML IV SOLN
4.0000 mg | Freq: Once | INTRAVENOUS | Status: AC
  Administered 2022-12-18: 4 mg via INTRAVENOUS
  Filled 2022-12-18: qty 1

## 2022-12-18 MED ORDER — ONDANSETRON HCL 4 MG/2ML IJ SOLN
4.0000 mg | Freq: Once | INTRAMUSCULAR | Status: AC
  Administered 2022-12-18: 4 mg via INTRAVENOUS
  Filled 2022-12-18: qty 2

## 2022-12-18 MED ORDER — INDOCYANINE GREEN 25 MG IV SOLR
1.2500 mg | Freq: Once | INTRAVENOUS | Status: DC
  Filled 2022-12-18: qty 10

## 2022-12-18 SURGICAL SUPPLY — 46 items
ADH SKN CLS APL DERMABOND .7 (GAUZE/BANDAGES/DRESSINGS) ×1
APL PRP STRL LF DISP 70% ISPRP (MISCELLANEOUS) ×1
APL SKNCLS STERI-STRIP NONHPOA (GAUZE/BANDAGES/DRESSINGS) ×1
APPLIER CLIP 5 13 M/L LIGAMAX5 (MISCELLANEOUS) ×1
APR CLP MED LRG 5 ANG JAW (MISCELLANEOUS) ×1
BAG COUNTER SPONGE SURGICOUNT (BAG) ×1 IMPLANT
BAG SPEC RTRVL 10 TROC 200 (ENDOMECHANICALS) ×1
BAG SPNG CNTER NS LX DISP (BAG) ×1
BENZOIN TINCTURE PRP APPL 2/3 (GAUZE/BANDAGES/DRESSINGS) ×1 IMPLANT
BNDG ADH 1X3 SHEER STRL LF (GAUZE/BANDAGES/DRESSINGS) ×3 IMPLANT
BNDG ADH THN 3X1 STRL LF (GAUZE/BANDAGES/DRESSINGS) ×3
CANISTER SUCT 3000ML PPV (MISCELLANEOUS) ×1 IMPLANT
CHLORAPREP W/TINT 26 (MISCELLANEOUS) ×1 IMPLANT
CLIP APPLIE 5 13 M/L LIGAMAX5 (MISCELLANEOUS) ×1 IMPLANT
COVER SURGICAL LIGHT HANDLE (MISCELLANEOUS) ×1 IMPLANT
DERMABOND ADVANCED .7 DNX12 (GAUZE/BANDAGES/DRESSINGS) IMPLANT
ELECT REM PT RETURN 9FT ADLT (ELECTROSURGICAL) ×1
ELECTRODE REM PT RTRN 9FT ADLT (ELECTROSURGICAL) ×1 IMPLANT
GAUZE SPONGE 2X2 8PLY STRL LF (GAUZE/BANDAGES/DRESSINGS) ×1 IMPLANT
GLOVE BIOGEL M STRL SZ7.5 (GLOVE) ×1 IMPLANT
GLOVE INDICATOR 7.5 STRL GRN (GLOVE) IMPLANT
GLOVE INDICATOR 8.0 STRL GRN (GLOVE) ×2 IMPLANT
GOWN STRL REUS W/ TWL LRG LVL3 (GOWN DISPOSABLE) ×2 IMPLANT
GOWN STRL REUS W/ TWL XL LVL3 (GOWN DISPOSABLE) ×1 IMPLANT
GOWN STRL REUS W/TWL LRG LVL3 (GOWN DISPOSABLE) ×2
GOWN STRL REUS W/TWL XL LVL3 (GOWN DISPOSABLE) ×1
GRASPER SUT TROCAR 14GX15 (MISCELLANEOUS) IMPLANT
IRRIG SUCT STRYKERFLOW 2 WTIP (MISCELLANEOUS) ×1
IRRIGATION SUCT STRKRFLW 2 WTP (MISCELLANEOUS) ×1 IMPLANT
KIT BASIN OR (CUSTOM PROCEDURE TRAY) ×1 IMPLANT
KIT TURNOVER KIT B (KITS) ×1 IMPLANT
NS IRRIG 1000ML POUR BTL (IV SOLUTION) ×1 IMPLANT
PAD ARMBOARD 7.5X6 YLW CONV (MISCELLANEOUS) ×1 IMPLANT
POUCH RETRIEVAL ECOSAC 10 (ENDOMECHANICALS) ×1 IMPLANT
SCISSORS LAP 5X35 DISP (ENDOMECHANICALS) ×1 IMPLANT
SET TUBE SMOKE EVAC HIGH FLOW (TUBING) ×1 IMPLANT
SLEEVE Z-THREAD 5X100MM (TROCAR) ×2 IMPLANT
SPECIMEN JAR SMALL (MISCELLANEOUS) ×1 IMPLANT
SUT MNCRL AB 4-0 PS2 18 (SUTURE) ×1 IMPLANT
SUT VICRYL 0 UR6 27IN ABS (SUTURE) IMPLANT
TOWEL GREEN STERILE (TOWEL DISPOSABLE) ×1 IMPLANT
TRAY LAPAROSCOPIC MC (CUSTOM PROCEDURE TRAY) ×1 IMPLANT
TROCAR BALLN 12MMX100 BLUNT (TROCAR) ×1 IMPLANT
TROCAR Z-THREAD OPTICAL 5X100M (TROCAR) ×1 IMPLANT
WARMER LAPAROSCOPE (MISCELLANEOUS) ×1 IMPLANT
WATER STERILE IRR 1000ML POUR (IV SOLUTION) ×1 IMPLANT

## 2022-12-18 NOTE — Anesthesia Preprocedure Evaluation (Addendum)
Anesthesia Evaluation  Patient identified by MRN, date of birth, ID band Patient awake    Reviewed: Allergy & Precautions, NPO status , Patient's Chart, lab work & pertinent test results  History of Anesthesia Complications (+) PONV and history of anesthetic complications  Airway Mallampati: II  TM Distance: >3 FB Neck ROM: Full    Dental no notable dental hx.    Pulmonary former smoker   Pulmonary exam normal        Cardiovascular negative cardio ROS Normal cardiovascular exam     Neuro/Psych   Anxiety Depression       GI/Hepatic Neg liver ROS,GERD  ,,cholecystitis   Endo/Other  negative endocrine ROS    Renal/GU negative Renal ROS     Musculoskeletal negative musculoskeletal ROS (+)    Abdominal   Peds  Hematology negative hematology ROS (+)   Anesthesia Other Findings Day of surgery medications reviewed with patient.  Reproductive/Obstetrics                              Anesthesia Physical Anesthesia Plan  ASA: 2  Anesthesia Plan: General   Post-op Pain Management: Ofirmev IV (intra-op)* and Toradol IV (intra-op)*   Induction: Intravenous  PONV Risk Score and Plan: 4 or greater and Treatment may vary due to age or medical condition, Ondansetron, Dexamethasone, Midazolam, Scopolamine patch - Pre-op, Propofol infusion and TIVA  Airway Management Planned: Oral ETT  Additional Equipment: None  Intra-op Plan:   Post-operative Plan: Extubation in OR  Informed Consent: I have reviewed the patients History and Physical, chart, labs and discussed the procedure including the risks, benefits and alternatives for the proposed anesthesia with the patient or authorized representative who has indicated his/her understanding and acceptance.     Dental advisory given  Plan Discussed with: CRNA  Anesthesia Plan Comments:         Anesthesia Quick Evaluation

## 2022-12-18 NOTE — Discharge Instructions (Signed)
[facility] ° °Please arrive at least 30 min before your appointment to complete your check in paperwork.  If you are unable to arrive 30 min prior to your appointment time we may have to cancel or reschedule you. °LAPAROSCOPIC SURGERY: POST OP INSTRUCTIONS °Always review your discharge instruction sheet given to you by the facility where your surgery was performed. °IF YOU HAVE DISABILITY OR FAMILY LEAVE FORMS, YOU MUST BRING THEM TO THE OFFICE FOR PROCESSING.   °DO NOT GIVE THEM TO YOUR DOCTOR. ° °PAIN CONTROL ° °First take acetaminophen (Tylenol) AND/or ibuprofen (Advil) to control your pain after surgery.  Follow directions on package.  Taking acetaminophen (Tylenol) and/or ibuprofen (Advil) regularly after surgery will help to control your pain and lower the amount of prescription pain medication you may need.  You should not take more than 4,000 mg (4 grams) of acetaminophen (Tylenol) in 24 hours.  You should not take ibuprofen (Advil), aleve, motrin, naprosyn or other NSAIDS if you have a history of stomach ulcers or chronic kidney disease.  °A prescription for pain medication may be given to you upon discharge.  Take your pain medication as prescribed, if you still have uncontrolled pain after taking acetaminophen (Tylenol) or ibuprofen (Advil). °Use ice packs to help control pain. °If you need a refill on your pain medication, please contact your pharmacy.  They will contact our office to request authorization. Prescriptions will not be filled after 5pm or on week-ends. ° °HOME MEDICATIONS °Take your usually prescribed medications unless otherwise directed. ° °DIET °You should follow a light diet the first few days after arrival home.  Be sure to include lots of fluids daily. Avoid fatty, fried foods.  ° °CONSTIPATION °It is common to experience some constipation after surgery and if you are taking pain medication.  Increasing fluid intake and taking a stool softener (such as Colace)  will usually help or prevent this problem from occurring.  A mild laxative (Milk of Magnesia or Miralax) should be taken according to package instructions if there are no bowel movements after 48 hours. ° °WOUND/INCISION CARE °Most patients will experience some swelling and bruising in the area of the incisions.  Ice packs will help.  Swelling and bruising can take several days to resolve.  °Unless discharge instructions indicate otherwise, follow guidelines below  °STERI-STRIPS - you may remove your outer bandages 48 hours after surgery, and you may shower at that time.  You have steri-strips (small skin tapes) in place directly over the incision.  These strips should be left on the skin for 7-10 days.   °DERMABOND/SKIN GLUE - you may shower in 24 hours.  The glue will flake off over the next 2-3 weeks. °Any sutures or staples will be removed at the office during your follow-up visit. ° °ACTIVITIES °You may resume regular (light) daily activities beginning the next day--such as daily self-care, walking, climbing stairs--gradually increasing activities as tolerated.  You may have sexual intercourse when it is comfortable.  Refrain from any heavy lifting or straining until approved by your doctor. °You may drive when you are no longer taking prescription pain medication, you can comfortably wear a seatbelt, and you can safely maneuver your car and apply brakes. ° °FOLLOW-UP °You should see your doctor in the office for a follow-up appointment approximately 2-3 weeks after your surgery.  You should have been given your post-op/follow-up appointment when your surgery was scheduled.  If you did not receive a post-op/follow-up appointment, make sure   that you call for this appointment within a day or two after you arrive home to insure a convenient appointment time. ° ° °WHEN TO CALL YOUR DOCTOR: °Fever over 101.0 °Inability to urinate °Continued bleeding from incision. °Increased pain, redness, or drainage from the  incision. °Increasing abdominal pain ° °The clinic staff is available to answer your questions during regular business hours.  Please don’t hesitate to call and ask to speak to one of the nurses for clinical concerns.  If you have a medical emergency, go to the nearest emergency room or call 911.  A surgeon from [facility] is always on call at the hospital. °[address], [address], [place], [place]  [address] ? [address] [address], [place], [place]   [address] °[phone] ? [phone] ? FAX [phone] ° ° ° ° °Managing Your Pain After Surgery Without Opioids ° ° ° °Thank you for participating in our program to help patients manage their pain after surgery without opioids. This is part of our effort to provide you with the best care possible, without exposing you or your family to the risk that opioids pose. ° °What pain can I expect after surgery? °You can expect to have some pain after surgery. This is normal. The pain is typically worse the day after surgery, and quickly begins to get better. °Many studies have found that many patients are able to manage their pain after surgery with Over-the-Counter (OTC) medications such as Tylenol and Motrin. If you have a condition that does not allow you to take Tylenol or Motrin, notify your surgical team. ° °How will I manage my pain? °The best strategy for controlling your pain after surgery is around the clock pain control with Tylenol (acetaminophen) and Motrin (ibuprofen or Advil). Alternating these medications with each other allows you to maximize your pain control. In addition to Tylenol and Motrin, you can use heating pads or ice packs on your incisions to help reduce your pain. ° °How will I alternate your regular strength over-the-counter pain medication? °You will take a dose of pain medication every three hours. °Start by taking 650 mg of Tylenol (2 pills of 325 mg) °3 hours later take 600 mg of Motrin (3 pills of 200 mg) °3 hours after  taking the Motrin take 650 mg of Tylenol °3 hours after that take 600 mg of Motrin. ° ° °- 1 - ° °See example - if your first dose of Tylenol is at 12:00 PM ° ° °12:00 PM Tylenol 650 mg (2 pills of 325 mg)  °3:00 PM Motrin 600 mg (3 pills of 200 mg)  °6:00 PM Tylenol 650 mg (2 pills of 325 mg)  °9:00 PM Motrin 600 mg (3 pills of 200 mg)  °Continue alternating every 3 hours  ° °We recommend that you follow this schedule around-the-clock for at least 3 days after surgery, or until you feel that it is no longer needed. Use the table on the last page of this handout to keep track of the medications you are taking. °Important: °Do not take more than 3000mg of Tylenol or 3200mg of Motrin in a 24-hour period. °Do not take ibuprofen/Motrin if you have a history of bleeding stomach ulcers, severe kidney disease, &/or actively taking a blood thinner ° °What if I still have pain? °If you have pain that is not controlled with the over-the-counter pain medications (Tylenol and Motrin or Advil) you might have what we call “breakthrough” pain. You will receive a prescription   for a small amount of an opioid pain medication such as Oxycodone, Tramadol, or Tylenol with Codeine. Use these opioid pills in the first 24 hours after surgery if you have breakthrough pain. Do not take more than 1 pill every 4-6 hours. ° °If you still have uncontrolled pain after using all opioid pills, don't hesitate to call our staff using the number provided. We will help make sure you are managing your pain in the best way possible, and if necessary, we can provide a prescription for additional pain medication. ° ° °Day 1   ° °Time  °Name of Medication Number of pills taken  °Amount of Acetaminophen  °Pain Level  ° °Comments  °AM PM       °AM PM       °AM PM       °AM PM       °AM PM       °AM PM       °AM PM       °AM PM       °Total Daily amount of Acetaminophen °Do not take more than  3,000 mg per day    ° ° °Day 2   ° °Time  °Name of Medication  Number of pills °taken  °Amount of Acetaminophen  °Pain Level  ° °Comments  °AM PM       °AM PM       °AM PM       °AM PM       °AM PM       °AM PM       °AM PM       °AM PM       °Total Daily amount of Acetaminophen °Do not take more than  3,000 mg per day    ° ° °Day 3   ° °Time  °Name of Medication Number of pills taken  °Amount of Acetaminophen  °Pain Level  ° °Comments  °AM PM       °AM PM       °AM PM       °AM PM       ° ° ° °AM PM       °AM PM       °AM PM       °AM PM       °Total Daily amount of Acetaminophen °Do not take more than  3,000 mg per day    ° ° °Day 4   ° °Time  °Name of Medication Number of pills taken  °Amount of Acetaminophen  °Pain Level  ° °Comments  °AM PM       °AM PM       °AM PM       °AM PM       °AM PM       °AM PM       °AM PM       °AM PM       °Total Daily amount of Acetaminophen °Do not take more than  3,000 mg per day    ° ° °Day 5   ° °Time  °Name of Medication Number °of pills taken  °Amount of Acetaminophen  °Pain Level  ° °Comments  °AM PM       °AM PM       °AM PM       °AM PM       °AM PM       °AM   PM       °AM PM       °AM PM       °Total Daily amount of Acetaminophen °Do not take more than  3,000 mg per day    ° ° ° °Day 6   ° °Time  °Name of Medication Number of pills °taken  °Amount of Acetaminophen  °Pain Level  °Comments  °AM PM       °AM PM       °AM PM       °AM PM       °AM PM       °AM PM       °AM PM       °AM PM       °Total Daily amount of Acetaminophen °Do not take more than  3,000 mg per day    ° ° °Day 7   ° °Time  °Name of Medication Number of pills taken  °Amount of Acetaminophen  °Pain Level  ° °Comments  °AM PM       °AM PM       °AM PM       °AM PM       °AM PM       °AM PM       °AM PM       °AM PM       °Total Daily amount of Acetaminophen °Do not take more than  3,000 mg per day    ° ° ° ° °For additional information about how and where to safely dispose of unused opioid °medications - https://www.morepowerfulnc.org ° °Disclaimer: This document  contains information and/or instructional materials adapted from [facility] for the typical patient with your condition. It does not replace medical advice from your health care provider because your experience may differ from that of the °typical patient. Talk to your health care provider if you have any questions about this °document, your condition or your treatment plan. °Adapted from [facility] ° °

## 2022-12-18 NOTE — ED Notes (Signed)
Patient ambulates to bathroom.

## 2022-12-18 NOTE — CV Procedure (Signed)
Teresa [name] [id number] 06/01/1981 12/18/2022  Laparoscopic Cholecystectomy with near infrared fluorescent cholangiography procedure Note  Indications: This patient presents with symptomatic gallbladder disease and will undergo laparoscopic cholecystectomy.  Pre-operative Diagnosis: Calculus of gallbladder with acute cholecystitis, without mention of obstruction  Post-operative Diagnosis: Same  Surgeon: Gaynelle Adu MD FACs  Assistants: Circulator: Virgel Bouquet, RN Scrub Person: Ivin Booty, RN; Hermelinda Dellen, RN  Anesthesia: General endotracheal anesthesia  Procedure Details  The patient was seen again in the Holding Room. The risks, benefits, complications, treatment options, and expected outcomes were discussed with the patient. The possibilities of reaction to medication, pulmonary aspiration, perforation of viscus, bleeding, recurrent infection, finding a normal gallbladder, the need for additional procedures, failure to diagnose a condition, the possible need to convert to an open procedure, and creating a complication requiring transfusion or operation were discussed with the patient. The likelihood of improving the patient's symptoms with return to their baseline status is good.  The patient and/or family concurred with the proposed plan, giving informed consent. The site of surgery properly noted. The patient was taken to Operating Room, identified as Limor Mahin and the procedure verified as Laparoscopic Cholecystectomy with ICG dye.  A Time Out was held and the above information confirmed. Antibiotic prophylaxis was administered.    ICG dye was administered preoperatively.    General endotracheal anesthesia was then administered and tolerated well. After the induction, the abdomen was prepped with Chloraprep and draped in the sterile fashion. The patient was positioned in the supine position.  Local anesthetic agent was injected into the skin near the  umbilicus and an incision made. We dissected down to the abdominal fascia with blunt dissection.  The fascia was incised vertically and we entered the peritoneal cavity bluntly.  A pursestring suture of 0-Vicryl was placed around the fascial opening.  The Hasson cannula was inserted and secured with the stay suture.  Pneumoperitoneum was then created with CO2 and tolerated well without any adverse changes in the patient's vital signs. An 5-mm port was placed in the subxiphoid position.  Two 5-mm ports were placed in the right upper quadrant. All skin incisions were infiltrated with a local anesthetic agent before making the incision and placing the trocars.   We positioned the patient in reverse Trendelenburg, tilted slightly to the patient's left.  The gallbladder was identified, the fundus grasped and retracted cephalad. There was edema in the gallbladder wall. Adhesions were lysed bluntly and with the electrocautery where indicated, taking care not to injure any adjacent organs or viscus. The infundibulum was grasped and retracted laterally, exposing the peritoneum overlying the triangle of Calot. This was then divided and exposed in a blunt fashion. A critical view of the cystic duct and cystic artery was obtained.  The cystic duct was clearly identified and bluntly dissected circumferentially.  Utilizing the Stryker camera system near infrared fluorescent activity was visualized in the liver, cystic duct, common hepatic duct and common bile duct and small bowel.  This served as a secondary confirmation of our anatomy.  The cystic duct was then ligated with clips and divided. The cystic artery which had been identified & dissected free was ligated with clips and divided as well.   The gallbladder was dissected from the liver bed in retrograde fashion with the electrocautery. The gallbladder was removed and placed in an Ecco sac.  The gallbladder and Ecco sac were then removed through the umbilical port  site. The liver bed was irrigated and inspected.  Hemostasis was achieved with the electrocautery. Copious irrigation was utilized and was repeatedly aspirated until clear.  The pursestring suture was used to close the umbilical fascia.    We again inspected the right upper quadrant for hemostasis.  The umbilical closure was inspected and there was no air leak and nothing trapped within the closure.  Because of her obesity I placed an additional interrupted 0 Vicryl at the umbilical fascia using PMI suture passer with laparoscopic guidance.  Pneumoperitoneum was released as we removed the trocars.  4-0 Monocryl was used to close the skin.   Dermabond was applied. The patient was then extubated and brought to the recovery room in stable condition. Instrument, sponge, and needle counts were correct at closure and at the conclusion of the case.   Findings: Cholecystitis with Cholelithiasis Near infrared fluorescent activity seen within the liver, common hepatic duct, common bile duct,, cystic duct and small bowel  Estimated Blood Loss: Minimal         Drains: none         Specimens: Gallbladder           Complications: None; patient tolerated the procedure well.         Disposition: PACU - hemodynamically stable.         Condition: stable  Mary Sella. Andrey Campanile, MD, FACS General, Bariatric, & Minimally Invasive Surgery [facility],  [facility]

## 2022-12-18 NOTE — ED Notes (Signed)
Called [facility] -- pt tx to OR via [facility]

## 2022-12-18 NOTE — H&P (Signed)
CC: nausea and abd pain  Requesting provider: Dr Eyvonne Mechanic  HPI: Teresa [name] is an 42 y.o. female who is here for surgical evaluation for upper abdominal pain that started last evening.  Patient eats that shortly after eating dinner last night she started to have complaints of chest pain and abdominal pain.  It was in her upper abdomen.  It was radiating into her chest and she felt nauseous and she decided to go to the emergency room for evaluation because of fear of potential cardiac event.  She had no shortness of breath or radiation to her arm and jaw.  No fever or chills.  She reports similar symptoms in the past but may be not as intense and not radiating to her chest.  Prior abdominal surgery has C-section x 2 and robotic salpingo-oophorectomy.  No melena hematochezia.  No dysuria or hematuria.  ER workup showed cholelithiasis and probable cholecystitis.  Past Medical History:  Diagnosis Date   Allergy    Anemia    Anxiety    Complication of anesthesia    bp dropped after c sections and turbinate reduction   Depression    wellbutrin in past   Gallstones    GERD (gastroesophageal reflux disease)    PONV (postoperative nausea and vomiting)    Right ovarian cyst     Past Surgical History:  Procedure Laterality Date   CESAREAN SECTION     CESAREAN SECTION N/A 08/24/2017   Procedure: CESAREAN SECTION;  Surgeon: Carrington Clamp, MD;  Location: [facility];  Service: Obstetrics;  Laterality: N/A;  Tracey RNFA   NASAL SEPTOPLASTY W/ TURBINOPLASTY  2009   ROBOTIC ASSISTED SALPINGO OOPHERECTOMY N/A 04/27/2019   Procedure: XI ROBOTIC ASSISTED RIGHT SALPINGO OOPHORECTOMY AND LEFT SALPINGECTOMY;  Surgeon: Carrington Clamp, MD;  Location: [facility];  Service: Gynecology;  Laterality: N/A;  left salpinoopherectomy right salpingectomy   WISDOM TOOTH EXTRACTION      Family History  Problem Relation Age of Onset   Hyperlipidemia Father    Hypertension Father     Fibromyalgia Father    Alcohol abuse Father    Hyperlipidemia Maternal Grandmother    Hypertension Maternal Grandmother    Stroke Maternal Grandmother    Osteoporosis Maternal Grandmother    Diabetes Maternal Grandfather    Hyperlipidemia Maternal Grandfather    Hypertension Maternal Grandfather    Hypertension Paternal Grandmother    Arthritis Paternal Grandmother    Cancer Paternal Grandmother    Cancer - Other Paternal Grandmother    Fibromyalgia Mother    Melanoma Mother    Lupus Sister    Sudden death Neg Hx     Social:  reports that she quit smoking about 19 years ago. Her smoking use included cigarettes. She has never used smokeless tobacco. She reports current alcohol use of about 5.0 - 6.0 standard drinks of alcohol per week. She reports that she does not use drugs.  Allergies:  Allergies  Allergen Reactions   Ciprofloxacin     Iv was rash and veins turned red   Penicillins Hives    Has patient had a PCN reaction causing immediate rash, facial/tongue/throat swelling, SOB or lightheadedness with hypotension: unknown Has patient had a PCN reaction causing severe rash involving mucus membranes or skin necrosis: unknown Has patient had a PCN reaction that required hospitalization: no  Has patient had a PCN reaction occurring within the last 10 years: no If all of the above answers are "NO", then may proceed with Cephalosporin use.  Sertraline Hcl     Studer zoloft    Medications: I have reviewed the patient's current medications.   ROS - all of the below systems have been reviewed with the patient and positives are indicated with bold text General: chills, fever or night sweats Eyes: blurry vision or double vision ENT: epistaxis or sore throat Allergy/Immunology: itchy/watery eyes or nasal congestion Hematologic/Lymphatic: bleeding problems, blood clots or swollen lymph nodes Endocrine: temperature intolerance or unexpected weight changes Breast: new or  changing breast lumps or nipple discharge Resp: cough, shortness of breath, or wheezing CV: chest pain or dyspnea on exertion GI: as per HPI GU: dysuria, trouble voiding, or hematuria MSK: joint pain or joint stiffness Neuro: TIA or stroke symptoms Derm: pruritus and skin lesion changes Psych: anxiety and depression  PE Blood pressure 132/89, pulse 81, temperature 98 F (36.7 C), temperature source Oral, resp. rate 18, height 5' 6.5" (1.689 m), weight 108.9 kg, last menstrual period 11/27/2022, SpO2 96 %, unknown if currently breastfeeding. Constitutional: NAD; conversant; no deformities Eyes: Moist conjunctiva; no lid lag; anicteric; PERRL Neck: Trachea midline; no thyromegaly Lungs: Normal respiratory effort; no tactile fremitus CV: RRR; no palpable thrills; no pitting edema GI: Abd soft, obese, old trocar & c/s scar; mild TTP RUQ; no palpable hepatosplenomegaly MSK: ; no clubbing/cyanosis Psychiatric: Appropriate affect; alert and oriented x3 Lymphatic: No palpable cervical or axillary lymphadenopathy Skin:no rash/lesion/jaundice  Results for orders placed or performed during the hospital encounter of 12/17/22 (from the past 48 hour(s))  Basic metabolic panel     Status: Abnormal   Collection Time: 12/17/22  9:56 PM  Result Value Ref Range   Sodium 136 135 - 145 mmol/L   Potassium 4.1 3.5 - 5.1 mmol/L   Chloride 103 98 - 111 mmol/L   CO2 21 (L) 22 - 32 mmol/L   Glucose, Bld 92 70 - 99 mg/dL    Comment: Glucose reference range applies only to samples taken after fasting for at least 8 hours.   BUN 14 6 - 20 mg/dL   Creatinine, Ser 6.29 0.44 - 1.00 mg/dL   Calcium 9.4 8.9 - 52.8 mg/dL   GFR, Estimated >41 >32 mL/min    Comment: (NOTE) Calculated using the CKD-EPI Creatinine Equation (2021)    Anion gap 12 5 - 15    Comment: Performed at [facility], [address], [place], [place] [address]  CBC     Status: None   Collection Time: 12/17/22  9:56 PM   Result Value Ref Range   WBC 9.7 4.0 - 10.5 K/uL   RBC 4.28 3.87 - 5.11 MIL/uL   Hemoglobin 13.1 12.0 - 15.0 g/dL   HCT 27.2 53.6 - 64.4 %   MCV 93.2 80.0 - 100.0 fL   MCH 30.6 26.0 - 34.0 pg   MCHC 32.8 30.0 - 36.0 g/dL   RDW 03.4 74.2 - 59.5 %   Platelets 315 150 - 400 K/uL   nRBC 0.0 0.0 - 0.2 %    Comment: Performed at [facility], [address] [place], [place] [address]  Troponin I (High Sensitivity)     Status: None   Collection Time: 12/17/22  9:56 PM  Result Value Ref Range   Troponin I (High Sensitivity) <2 <18 ng/L    Comment: (NOTE) Elevated high sensitivity troponin I (hsTnI) values and significant  changes across serial measurements may suggest ACS but many other  chronic and acute conditions are known to elevate hsTnI results.  Refer  to the "Links" section for chest pain algorithms and additional  guidance. Performed at [facility], [address] [place], [place] [address]   Troponin I (High Sensitivity)     Status: None   Collection Time: 12/18/22 12:54 AM  Result Value Ref Range   Troponin I (High Sensitivity) <2 <18 ng/L    Comment: (NOTE) Elevated high sensitivity troponin I (hsTnI) values and significant  changes across serial measurements may suggest ACS but many other  chronic and acute conditions are known to elevate hsTnI results.  Refer to the "Links" section for chest pain algorithms and additional  guidance. Performed at [facility], [address] [place], [place] [address]   Hepatic function panel     Status: Abnormal   Collection Time: 12/18/22 12:54 AM  Result Value Ref Range   Total Protein 7.6 6.5 - 8.1 g/dL   Albumin 4.4 3.5 - 5.0 g/dL   AST 13 (L) 15 - 41 U/L   ALT 15 0 - 44 U/L   Alkaline Phosphatase 28 (L) 38 - 126 U/L   Total Bilirubin 0.4 0.3 - 1.2 mg/dL   Bilirubin, Direct 0.1 0.0 - 0.2 mg/dL   Indirect Bilirubin 0.3 0.3 - 0.9 mg/dL    Comment: Performed at  [facility], [address], [place], [place] [address]  Lipase, blood     Status: None   Collection Time: 12/18/22 12:54 AM  Result Value Ref Range   Lipase 18 11 - 51 U/L    Comment: Performed at [facility], [address], [place], [place] [address]  Pregnancy, urine     Status: None   Collection Time: 12/18/22  8:05 AM  Result Value Ref Range   Preg Test, Ur NEGATIVE NEGATIVE    Comment:        THE SENSITIVITY OF THIS METHODOLOGY IS >25 mIU/mL. Performed at [facility], [address], [place], [place] [address]     US Abdomen Limited RUQ (LIVER/GB)  Result Date: 12/18/2022 CLINICAL DATA:  Right upper quadrant pain EXAM: ULTRASOUND ABDOMEN LIMITED RIGHT UPPER QUADRANT COMPARISON:  03/09/2019 FINDINGS: Gallbladder: 1.9 cm non mobile gallstone in the neck of the gallbladder. Gallbladder wall is thickened at 5 mm. Small amount of pericholecystic fluid. Cannot assess for sonographic Murphy's sign due to pain medications. Common bile duct: Diameter: Upper limits normal in caliber, 6-7 mm. Liver: Heterogeneous, increased echotexture. No focal abnormality. Portal vein is patent on color Doppler imaging with normal direction of blood flow towards the liver. Other: None. IMPRESSION: 1.9 cm gallstone within the gallbladder neck. Mild gallbladder wall thickening and small amount of pericholecystic fluid. Cannot exclude acute cholecystitis. Fatty liver. Electronically Signed   By: Charlett Nose M.D.   On: 12/18/2022 01:36   DG Chest 2 View  Result Date: 12/17/2022 CLINICAL DATA:  Chest pain. EXAM: CHEST - 2 VIEW COMPARISON:  Chest radiograph dated 08/28/2012 FINDINGS: No focal consolidation, pleural effusion, or pneumothorax. The cardiac silhouette is within normal limits. No acute osseous pathology. IMPRESSION: No active cardiopulmonary disease. Electronically Signed   By: Elgie Collard M.D.   On: 12/17/2022 22:17     Imaging: Personally reviewed  A/P: Teresa [name] is an 42 y.o. female with acute calculous cholecystitis  IV abx Icg dye   I believe the patient's symptoms are consistent with gallbladder disease.  We discussed gallbladder disease.  We discussed non-operative and operative management. We discussed the signs & symptoms of acute cholecystitis  I discussed  laparoscopic cholecystectomy with IOC in detail.  The patient was shown diagrams detailing the procedure.  We discussed the risks and benefits of a laparoscopic cholecystectomy including, but not limited to bleeding, infection, injury to surrounding structures such as the intestine or liver, bile leak, retained gallstones, need to convert to an open procedure, prolonged diarrhea, blood clots such as  DVT, common bile duct injury, anesthesia risks, and possible need for additional procedures.  We discussed the typical post-operative recovery course. I explained that the likelihood of improvement of their symptoms is good. Discussed use of icg dye  Data reviewed -labs, ultrasound, ER provider note, ED nurse notes, GYN op note from 2020   Karna Abed M. Andrey Campanile, MD, FACS General, Bariatric, & Minimally Invasive Surgery [facility]

## 2022-12-18 NOTE — ED Provider Notes (Signed)
[place] EMERGENCY DEPARTMENT AT [facility] Provider Note   CSN: [id number] Arrival date & time: 12/17/22  2143     History  Chief Complaint  Patient presents with   Chest Pain    Teresa [name] is a 42 y.o. female.  Patient is a 42 year old female with history of prior C-sections.  Patient presenting today with complaints of chest and abdominal pain.  This started this evening shortly after eating dinner.  She describes discomfort to the epigastric region, now radiating into her chest.  She felt nauseated and decided to come for evaluation.  She describes the discomfort as a pressure with no shortness of breath or radiation to the arm or jaw.  She has no prior cardiac history and denies any recent exertional symptoms.  No aggravating or alleviating factors.  The history is provided by the patient.       Home Medications Prior to Admission medications   Medication Sig Start Date End Date Taking? Authorizing Provider  buPROPion (WELLBUTRIN XL) 300 MG 24 hr tablet Take 300 mg by mouth daily.    [provider]  calcium carbonate (TUMS - DOSED IN MG ELEMENTAL CALCIUM) 500 MG chewable tablet Chew 1 tablet by mouth 3 (three) times daily as needed for indigestion or heartburn.    [provider]  cetirizine (ZYRTEC) 10 MG tablet Take 10 mg by mouth daily.    [provider]  fluticasone (FLONASE) 50 MCG/ACT nasal spray Place 2 sprays into both nostrils daily. Patient taking differently: Place 1 spray into both nostrils daily.  10/14/13   Guest, Ashley Jacobs, MD  NON FORMULARY norithedrone acetate/estradiol    [provider]  omeprazole (PRILOSEC) 20 MG capsule Take 20 mg by mouth daily.    [provider]  oxyCODONE-acetaminophen (PERCOCET/ROXICET) 5-325 MG tablet Take 1-2 tablets by mouth every 4 (four) hours as needed for severe pain. 04/27/19   Carrington Clamp, MD      Allergies    Ciprofloxacin, Penicillins, and Sertraline hcl     Review of Systems   Review of Systems  All other systems reviewed and are negative.   Physical Exam Updated Vital Signs BP (!) 144/92 (BP Location: Right Arm)   Pulse 84   Temp 98.5 F (36.9 C) (Oral)   Resp 18   Ht 5\' 6"  (1.676 m)   Wt 108.9 kg   LMP 11/27/2022 (Approximate)   SpO2 100%   BMI 38.74 kg/m  Physical Exam Vitals and nursing note reviewed.  Constitutional:      General: She is not in acute distress.    Appearance: She is well-developed. She is not diaphoretic.  HENT:     Head: Normocephalic and atraumatic.  Cardiovascular:     Rate and Rhythm: Normal rate and regular rhythm.     Heart sounds: No murmur heard.    No friction rub. No gallop.  Pulmonary:     Effort: Pulmonary effort is normal. No respiratory distress.     Breath sounds: Normal breath sounds. No wheezing.  Abdominal:     General: Bowel sounds are normal. There is no distension.     Palpations: Abdomen is soft.     Tenderness: There is abdominal tenderness.     Comments: There is tenderness in the epigastric region.  There is no rebound or guarding.  Musculoskeletal:        General: Normal range of motion.     Cervical back: Normal range of motion and neck supple.  Skin:  General: Skin is warm and dry.  Neurological:     General: No focal deficit present.     Mental Status: She is alert and oriented to person, place, and time.     ED Results / Procedures / Treatments   Labs (all labs ordered are listed, but only abnormal results are displayed) Labs Reviewed  BASIC METABOLIC PANEL - Abnormal; Notable for the following components:      Result Value   CO2 21 (*)    All other components within normal limits  CBC  PREGNANCY, URINE  HEPATIC FUNCTION PANEL  LIPASE, BLOOD  TROPONIN I (HIGH SENSITIVITY)  TROPONIN I (HIGH SENSITIVITY)    EKG EKG Interpretation Date/Time:  Tuesday December 17 2022 21:51:21 EDT Ventricular Rate:  85 PR Interval:  146 QRS Duration:  84 QT  Interval:  372 QTC Calculation: 442 R Axis:   71  Text Interpretation: Normal sinus rhythm Normal ECG When compared with ECG of 09-Mar-2019 11:18, No significant change was found Confirmed by Geoffery Lyons ([id number]) on 12/18/2022 12:18:32 AM  Radiology DG Chest 2 View  Result Date: 12/17/2022 CLINICAL DATA:  Chest pain. EXAM: CHEST - 2 VIEW COMPARISON:  Chest radiograph dated 08/28/2012 FINDINGS: No focal consolidation, pleural effusion, or pneumothorax. The cardiac silhouette is within normal limits. No acute osseous pathology. IMPRESSION: No active cardiopulmonary disease. Electronically Signed   By: Elgie Collard M.D.   On: 12/17/2022 22:17    Procedures Procedures    Medications Ordered in ED Medications - No data to display  ED Course/ Medical Decision Making/ A&P  Patient is a 42 year old female presenting with complaints of epigastric and chest pain as described in the HPI.  She arrives here with stable vital signs and is afebrile.  Physical examination reveals tenderness to palpation in the right upper quadrant and epigastric region.  Workup initiated including CBC, CMP, lipase, and troponin x 2.  All studies have returned unremarkable.  There is no leukocytosis or elevation of her liver or pancreatic enzymes.  Her cardiac enzymes are also flat.  Due to her abdominal tenderness and reported symptoms, an ultrasound of the right upper quadrant was obtained showing a 1.9 cm stone in the neck of the gallbladder along with gallbladder thickening and pericholecystic fluid consistent with acute cholecystitis.    Patient has received IV fluids along with morphine and Zofran.  This did help her pain somewhat.  Care was discussed with Dr. Sheliah Hatch.  Due to the patient's ongoing discomfort, she will be scheduled for cholecystectomy in the morning.  Final Clinical Impression(s) / ED Diagnoses Final diagnoses:  None    Rx / DC Orders ED Discharge Orders     None         Geoffery Lyons, MD 12/18/22 [id number]

## 2022-12-18 NOTE — Transfer of Care (Signed)
Immediate Anesthesia Transfer of Care Note  Patient: Teresa [name]  Procedure(s) Performed: LAPAROSCOPIC CHOLECYSTECTOMY (Abdomen)  Patient Location: PACU  Anesthesia Type:General  Level of Consciousness: awake, alert , and oriented  Airway & Oxygen Therapy: Patient Spontanous Breathing  Post-op Assessment: Report given to RN, Post -op Vital signs reviewed and stable, and Patient moving all extremities X 4  Post vital signs: Reviewed and stable  Last Vitals:  Vitals Value Taken Time  BP 121/79 12/18/22 1135  Temp    Pulse 85 12/18/22 1138  Resp 15 12/18/22 1138  SpO2 95 % 12/18/22 1138  Vitals shown include unvalidated device data.  Last Pain:  Vitals:   12/18/22 0915  TempSrc:   PainSc: 0-No pain         Complications: No notable events documented.

## 2022-12-18 NOTE — Anesthesia Postprocedure Evaluation (Signed)
Anesthesia Post Note  Patient: Teresa [name]  Procedure(s) Performed: LAPAROSCOPIC CHOLECYSTECTOMY (Abdomen)     Patient location during evaluation: PACU Anesthesia Type: General Level of consciousness: awake and alert Pain management: pain level controlled Vital Signs Assessment: post-procedure vital signs reviewed and stable Respiratory status: spontaneous breathing, nonlabored ventilation and respiratory function stable Cardiovascular status: blood pressure returned to baseline Postop Assessment: no apparent nausea or vomiting Anesthetic complications: no   No notable events documented.  Last Vitals:  Vitals:   12/18/22 1200 12/18/22 1215  BP: 124/84 116/82  Pulse: 60 65  Resp: 13 12  Temp:    SpO2: 97% 95%    Last Pain:  Vitals:   12/18/22 1200  TempSrc:   PainSc: 6                  Shanda Howells

## 2022-12-18 NOTE — Anesthesia Procedure Notes (Signed)
Procedure Name: Intubation Date/Time: 12/18/2022 10:19 AM  Performed by: Quentin Ore, CRNAPre-anesthesia Checklist: Patient identified, Emergency Drugs available, Suction available and Patient being monitored Patient Re-evaluated:Patient Re-evaluated prior to induction Oxygen Delivery Method: Circle system utilized Preoxygenation: Pre-oxygenation with 100% oxygen Induction Type: IV induction Ventilation: Mask ventilation without difficulty Laryngoscope Size: Mac and 3 Grade View: Grade I Tube type: Oral Tube size: 7.0 mm Number of attempts: 1 Airway Equipment and Method: Stylet Placement Confirmation: ETT inserted through vocal cords under direct vision, positive ETCO2 and breath sounds checked- equal and bilateral Secured at: 21 cm Tube secured with: Tape Dental Injury: Teeth and Oropharynx as per pre-operative assessment

## 2022-12-19 ENCOUNTER — Encounter (HOSPITAL_COMMUNITY): Payer: Self-pay | Admitting: General Surgery

## 2022-12-20 LAB — SURGICAL PATHOLOGY

## 2023-06-10 ENCOUNTER — Other Ambulatory Visit (HOSPITAL_BASED_OUTPATIENT_CLINIC_OR_DEPARTMENT_OTHER): Payer: Self-pay | Admitting: Family Medicine

## 2023-06-10 DIAGNOSIS — E782 Mixed hyperlipidemia: Secondary | ICD-10-CM

## 2023-07-30 ENCOUNTER — Encounter (HOSPITAL_BASED_OUTPATIENT_CLINIC_OR_DEPARTMENT_OTHER): Payer: Self-pay

## 2023-07-30 ENCOUNTER — Ambulatory Visit (HOSPITAL_BASED_OUTPATIENT_CLINIC_OR_DEPARTMENT_OTHER): Payer: BC Managed Care – PPO

## 2023-08-04 ENCOUNTER — Ambulatory Visit (HOSPITAL_COMMUNITY)
Admission: RE | Admit: 2023-08-04 | Discharge: 2023-08-04 | Disposition: A | Discharge status: Home / Self Care | Payer: Self-pay | Source: Ambulatory Visit | Attending: Family Medicine | Admitting: Family Medicine

## 2023-08-04 DIAGNOSIS — E782 Mixed hyperlipidemia: Secondary | ICD-10-CM | POA: Insufficient documentation
